# Patient Record
Sex: Female | Born: 1955
Health system: Southern US, Community
[De-identification: ages and names within clinical notes are randomized; demographics above are authoritative.]

## PROBLEM LIST (undated history)

## (undated) DIAGNOSIS — I10 Essential (primary) hypertension: Secondary | ICD-10-CM

## (undated) HISTORY — PX: TONSILLECTOMY: SUR1361

## (undated) HISTORY — PX: ABDOMINAL HYSTERECTOMY: SHX81

## (undated) HISTORY — PX: CARPAL TUNNEL RELEASE: SHX101

## (undated) HISTORY — PX: KNEE ARTHROSCOPY: SUR90

---

## 1998-09-22 ENCOUNTER — Other Ambulatory Visit: Admission: RE | Admit: 1998-09-22 | Discharge: 1998-09-22 | Payer: Self-pay | Admitting: Obstetrics and Gynecology

## 2002-01-20 ENCOUNTER — Emergency Department (HOSPITAL_COMMUNITY): Admission: EM | Admit: 2002-01-20 | Discharge: 2002-01-20 | Payer: Self-pay | Admitting: Emergency Medicine

## 2004-10-11 ENCOUNTER — Encounter: Admission: RE | Admit: 2004-10-11 | Discharge: 2004-10-11 | Payer: Self-pay | Admitting: Obstetrics and Gynecology

## 2004-10-24 ENCOUNTER — Encounter: Admission: RE | Admit: 2004-10-24 | Discharge: 2004-10-24 | Payer: Self-pay | Admitting: Obstetrics and Gynecology

## 2004-11-24 ENCOUNTER — Inpatient Hospital Stay (HOSPITAL_COMMUNITY): Admission: EM | Admit: 2004-11-24 | Discharge: 2004-11-25 | Payer: Self-pay | Admitting: Emergency Medicine

## 2008-10-26 ENCOUNTER — Emergency Department (HOSPITAL_COMMUNITY): Admission: EM | Admit: 2008-10-26 | Discharge: 2008-10-27 | Payer: Self-pay | Admitting: Emergency Medicine

## 2010-02-11 ENCOUNTER — Encounter: Payer: Self-pay | Admitting: Obstetrics and Gynecology

## 2010-04-27 LAB — CBC
HCT: 37.4 % (ref 36.0–46.0)
Hemoglobin: 12.5 g/dL (ref 12.0–15.0)
MCHC: 33.5 g/dL (ref 30.0–36.0)
MCV: 85.3 fL (ref 78.0–100.0)
Platelets: 257 10*3/uL (ref 150–400)
RBC: 4.38 MIL/uL (ref 3.87–5.11)
RDW: 13.8 % (ref 11.5–15.5)
WBC: 7.4 10*3/uL (ref 4.0–10.5)

## 2010-04-27 LAB — POCT CARDIAC MARKERS
CKMB, poc: 1.2 ng/mL (ref 1.0–8.0)
CKMB, poc: 3.4 ng/mL (ref 1.0–8.0)
Myoglobin, poc: 107 ng/mL (ref 12–200)
Myoglobin, poc: 52.2 ng/mL (ref 12–200)
Troponin i, poc: <0.05 ng/mL (ref 0.00–0.09)
Troponin i, poc: <0.05 ng/mL (ref 0.00–0.09)

## 2010-04-27 LAB — DIFFERENTIAL
Basophils Absolute: 0.1 10*3/uL (ref 0.0–0.1)
Basophils Relative: 1 % (ref 0–1)
Eosinophils Absolute: 0.2 10*3/uL (ref 0.0–0.7)
Eosinophils Relative: 3 % (ref 0–5)
Lymphocytes Relative: 43 % (ref 12–46)
Lymphs Abs: 3.2 10*3/uL (ref 0.7–4.0)
Monocytes Absolute: 0.4 10*3/uL (ref 0.1–1.0)
Monocytes Relative: 6 % (ref 3–12)
Neutro Abs: 3.5 10*3/uL (ref 1.7–7.7)
Neutrophils Relative %: 48 % (ref 43–77)

## 2010-04-27 LAB — COMPREHENSIVE METABOLIC PANEL
ALT: 13 U/L (ref 0–35)
AST: 18 U/L (ref 0–37)
Albumin: 4 g/dL (ref 3.5–5.2)
Alkaline Phosphatase: 83 U/L (ref 39–117)
BUN: 12 mg/dL (ref 6–23)
CO2: 27 mEq/L (ref 19–32)
Calcium: 9.4 mg/dL (ref 8.4–10.5)
Chloride: 103 mEq/L (ref 96–112)
Creatinine, Ser: 0.93 mg/dL (ref 0.4–1.2)
GFR calc Af Amer: >60 mL/min (ref >60)
GFR calc non Af Amer: >60 mL/min (ref >60)
Glucose, Bld: 87 mg/dL (ref 70–99)
Potassium: 4.1 mEq/L (ref 3.5–5.1)
Sodium: 137 mEq/L (ref 135–145)
Total Bilirubin: 0.5 mg/dL (ref 0.3–1.2)
Total Protein: 7 g/dL (ref 6.0–8.3)

## 2010-06-09 NOTE — Discharge Summary (Signed)
Samantha Bryant, Samantha Bryant                  ACCOUNT NO.:  [id number]   MEDICAL RECORD NO.:  [id number]          PATIENT TYPE:  INP   LOCATION:  3701                         FACILITY:  [facility]   PHYSICIAN:  Georga Hacking, M.D.DATE OF BIRTH:  November 26, 1955   DATE OF ADMISSION:  11/24/2004  DATE OF DISCHARGE:  11/25/2004                                 DISCHARGE SUMMARY   FINAL DIAGNOSES:  1.  Atypical chest pain of undetermined etiology, myocardial infarction      ruled out.      1.  Negative adenosine Cardiolite scan.      2.  Negative chest CT scan for acute cardiac problems.  2.  Cigarette abuse.  3.  Hyperlipidemia.  4.  Arm numbness of uncertain etiology.      1.  Negative MRI of the brain.  5.  Obesity.  6.  Low density liver lesion of uncertain cause.   PROCEDURE:  1.  CT scan of chest.  2.  Adenosine Cardiolite.  3.  MRI of the brain.   HISTORY:  A 55 year old black female who had some isolated chest discomfort  and then had sharp chest discomfort that was pleuritic the next day.  She  then had arm numbness and was seen at an urgent care and was sent to the  [facility] Emergency Room.  Initial enzymes were negative, and EKG was  normal, and she was given morphine and was admitted to rule out a myocardial  infarction.  Please see the previously dictated history and physical for the  remainder of the details.   HOSPITAL COURSE:  Serial EKGs were negative and serial cardiac enzymes  showed no abnormality.  Laboratory studies showed an abnormal lipid panel  with total cholesterol of over 260 with a LDL cholesterol of 200.  Chemistry  panel was normal.  CBC was normal.  She had an MRI of the brain that showed  no evidence of acute stroke.  She had a CT scan of her chest that showed no  abnormality involving the aorta or the heart and no evidence of pulmonary  emboli.  She had a low density lesion involving her liver that was thought  possibly to be benign but followup will need to be  done by her primary  doctor.  An adenosine Cardiolite scan was done the next day when she had too  much artifact on a baseline EKG with exercise to do treadmill testing.  This  is negative for ischemia with a normal ejection fraction.   At this point, she is discharged home with instructions to discontinue  smoking and to loose weight.   She is discharged on:  1.  Lipitor 40 mg for treatment of hyperlipidemia.  2.  Aspirin 81 mg daily taken with food.  3.  Protonix 40 mg daily.   She is to follow up with myself and Dr. Dagoberto Ligas in one week.      Georga Hacking, M.D.  Electronically Signed     WST/MEDQ  D:  11/25/2004  T:  11/25/2004  Job:  [id number]  cc:   Alfonse Alpers. Dagoberto Ligas, M.D.  Fax: [phone]

## 2010-06-09 NOTE — H&P (Signed)
Samantha Bryant, FRITCHMAN                  ACCOUNT NO.:  [id number]   MEDICAL RECORD NO.:  [id number]          PATIENT TYPE:  EMS   LOCATION:  MAJO                         FACILITY:  [facility]   PHYSICIAN:  Georga Hacking, M.D.DATE OF BIRTH:  31-Jul-1955   DATE OF ADMISSION:  11/24/2004  DATE OF DISCHARGE:                                HISTORY & PHYSICAL   REASON FOR ADMISSION:  Chest pain.   HISTORY:  This 55 year old female is seen in the emergency room  at the  request of Dr. Dagoberto Ligas and the ER physician for evaluation of chest pain.  The patient has a history of obesity and cigarette abuse, but has previously  been in good health.  She, yesterday, developed mid sternal chest discomfort  while standing that was vague and lasted around 5-10 minutes but was not any  different.  She then had complained of having some swelling in her legs  around three weeks ago for which she had seen Dr. Dagoberto Ligas but reported no  injury.  This morning, she was on her way to work and developed sharp chest  pain in her mid sternal chest that was griping and felt as if she had to  bend over.  She then complained of arm pain that radiated down her left arm  and numbness in her left arm, became frightened and went to the Urgent Care  Center.  She was noted to have some pleuritic pain there and then the pain  would wax and wan and she eventually was sent to the [facility] Emergency  Room by ambulance.  Initial point of care enzymes were negative and initial  EKG was normal.  Initial labs were normal.  She has been given morphine and  reports the arm numbness is somewhat better but still present, somewhat, in  her hand.   She has felt well recently except for the fluid retention and denies a  previous history of angina, exercise intolerance, PND, orthopnea, edema,  syncope, palpitations, or claudication.   PAST MEDICAL HISTORY:  Her past history is benign.  She had some sort of a  thyroid nodule or thyroid problem  previously.   PAST SURGICAL HISTORY:  Tonsillectomy and partial hysterectomy.   ALLERGIES:  None.   CURRENT MEDICATIONS:  None.   FAMILY HISTORY:  Her father's health is unknown.  Her mother died of  pneumonia at age 98.  One brother died of a heart attack at age 61.  She has  multiple brothers and sisters, otherwise.   SOCIAL HISTORY:  She currently lives with a brother and sister.  She has one  daughter.  She works as a Production designer, theatre/television/film at a [employer].  She does not  use alcohol to excess.  She smokes a pack of cigarettes per week.   REVIEW OF SYMPTOMS:  She has gained 14 pounds over the past year.  She has  no skin problems.  She has no diplopia, glaucoma, cataracts, or macular  degeneration.  No difficulty hearing, hemoptysis, or difficulty swallowing.  She normally has no dysphagia, dyspepsia, hematochezia, diarrhea, or  melena.  No dysuria, urgency, or frequency.  Occasional mild arthritis involving her  feet, some swelling involving her feet previously.  No history of TIA,  stroke, or headache.  No history of allergies or serious bleeding disorder.   PHYSICAL EXAMINATION:  GENERAL:  She is a pleasant, obese black female who is a vague historian but  appears in no acute distress.  VITAL SIGNS:  Blood pressure is 120/70, pulse 60 and regular.  SKIN:  Warm and dry.  HEENT:  EOMI, PERRLA, CNS clear, fundi not examined, pharynx negative.  NECK:  Supple without masses, JVD, thyromegaly, or bruits.  LUNGS:  Clear to A&P.  CARDIOVASCULAR:  Normal S1 and S2, no S3, S4, or murmur.  ABDOMEN:  Soft, nontender, no masses, hepatosplenomegaly, or aneurysm.  EXTREMITIES:  Femoral and distal pulses are 2+, there is no edema noted.  No  Homan's sign is noted.   LABORATORY DATA:  EKG shows sinus bradycardia.  Initial point of care  enzymes, chemistry panel, and CBC are normal.   IMPRESSION:  1.  Chest pain with atypical features, being somewhat pleuritic, rule out      pulmonary  embolus, noncardiac pain.  2.  Arm numbness with atypical features, rule out stroke, or atypical      presentation of cardiac pain.  3.  Cigarette abuse.  4.  Obesity.   RECOMMENDATIONS:  Begin Lovenox, aspirin, beta blockers.  Check CT scan of  the chest.  MRI of the head.  Check lipid panel.  If rules out, consider  Cardiolite study if enzymes are negative.      Georga Hacking, M.D.  Electronically Signed     WST/MEDQ  D:  11/24/2004  T:  11/24/2004  Job:  [id number]   cc:   Alfonse Alpers. Dagoberto Ligas, M.D.  Fax: [phone]

## 2011-10-03 ENCOUNTER — Encounter (HOSPITAL_COMMUNITY): Payer: Self-pay

## 2011-10-03 ENCOUNTER — Emergency Department (HOSPITAL_COMMUNITY): Payer: Worker's Compensation

## 2011-10-03 ENCOUNTER — Emergency Department (HOSPITAL_COMMUNITY)
Admission: EM | Admit: 2011-10-03 | Discharge: 2011-10-03 | Disposition: A | Discharge status: Home / Self Care | Payer: Worker's Compensation | Attending: Emergency Medicine | Admitting: Emergency Medicine

## 2011-10-03 DIAGNOSIS — Y92009 Unspecified place in unspecified non-institutional (private) residence as the place of occurrence of the external cause: Secondary | ICD-10-CM | POA: Insufficient documentation

## 2011-10-03 DIAGNOSIS — M542 Cervicalgia: Secondary | ICD-10-CM | POA: Insufficient documentation

## 2011-10-03 DIAGNOSIS — S0990XA Unspecified injury of head, initial encounter: Secondary | ICD-10-CM

## 2011-10-03 DIAGNOSIS — S161XXA Strain of muscle, fascia and tendon at neck level, initial encounter: Secondary | ICD-10-CM

## 2011-10-03 DIAGNOSIS — S139XXA Sprain of joints and ligaments of unspecified parts of neck, initial encounter: Secondary | ICD-10-CM | POA: Insufficient documentation

## 2011-10-03 DIAGNOSIS — W11XXXA Fall on and from ladder, initial encounter: Secondary | ICD-10-CM | POA: Insufficient documentation

## 2011-10-03 DIAGNOSIS — W19XXXA Unspecified fall, initial encounter: Secondary | ICD-10-CM

## 2011-10-03 MED ORDER — HYDROCODONE-ACETAMINOPHEN 5-500 MG PO TABS: 1.0000 | ORAL_TABLET | Freq: Four times a day (QID) | ORAL | Status: AC | PRN

## 2011-10-03 MED ORDER — OXYCODONE-ACETAMINOPHEN 5-325 MG PO TABS: 1.0000 | ORAL_TABLET | Freq: Four times a day (QID) | ORAL | Status: AC | PRN

## 2011-10-03 NOTE — ED Notes (Signed)
Pt was on 66ft ladder and fell onto back onto floor of [employer]. Pt c/o left hand, right forearm and back of head.  No LOC. Pt received in c-collar

## 2011-10-03 NOTE — ED Provider Notes (Signed)
History     CSN: [id number]  Arrival date & time 10/03/11  1622   First MD Initiated Contact with Patient 10/03/11 1755      Chief Complaint  Patient presents with  . Fall    (Consider location/radiation/quality/duration/timing/severity/associated sxs/prior treatment) Patient is a 56 y.o. female presenting with fall. The history is provided by the patient.  Fall The accident occurred 1 to 2 hours ago. 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 She fell from a height of 1 to 2 ft. She landed on a hard floor. There was no blood loss. The point of impact was the head and neck (left hand). The pain is present in the head and neck (left hand). The pain is moderate. She was not ambulatory at the scene. Pertinent negatives include no visual change and no abdominal pain. The symptoms are aggravated by activity (movement). She has tried nothing for the symptoms.    No past medical history on file.  Past Surgical History  Procedure Date  . Abdominal hysterectomy   . Tonsillectomy   . Knee arthroscopy   . Carpal tunnel release     No family history on file.  History  Substance Use Topics  . Smoking status: Never Smoker   . Smokeless tobacco: Not on file  . Alcohol Use: No    OB History    Grav Para Term Preterm Abortions TAB SAB Ect Mult Living                  Review of Systems  Gastrointestinal: Negative for abdominal pain.  All other systems reviewed and are negative.    Allergies  Review of patient's allergies indicates no known allergies.  Home Medications   Current Outpatient Rx  Name Route Sig Dispense Refill  . NAPROXEN SODIUM 220 MG PO TABS Oral Take 220 mg by mouth 2 (two) times daily as needed. For pain      BP 117/68  Pulse 53  Temp 97.9 F (36.6 C) (Oral)  Resp 16  SpO2 100%  Physical Exam  Nursing note and vitals reviewed. Constitutional: She is oriented to person, place, and time. She appears well-developed and well-nourished. No distress.    HENT:  Head: Normocephalic and atraumatic.       TM's are clear bilaterally.  Eyes: EOM are normal. Pupils are equal, round, and reactive to light.  Neck:       There is ttp in the soft tissues of the mid neck.  There is no bony ttp and no stepoffs.    Cardiovascular: Normal rate and regular rhythm.  Exam reveals no gallop and no friction rub.   No murmur heard. Pulmonary/Chest: Effort normal and breath sounds normal. No respiratory distress. She has no wheezes.  Abdominal: Soft. Bowel sounds are normal. She exhibits no distension. There is no tenderness.  Musculoskeletal: Normal range of motion.       The left hand is ttp but there is no obvious swelling or deformity.    Neurological: She is alert and oriented to person, place, and time. No cranial nerve deficit. Coordination normal.  Skin: Skin is warm and dry. She is not diaphoretic.    ED Course  Procedures (including critical care time)  Labs Reviewed - No data to display No results found.   No diagnosis found.    MDM  The ct is negative for fracture or intracranial abnormality.  The xray of the hand shows what appears to be a fracture of the distal  thumb, however she has no pain or swelling in that area and range of motion is full without limitation.  I suspect this is old.  She will be discharged with rest, time, and medications for pain.  To return prn.        Geoffery Lyons, MD 10/04/11 [id number]

## 2011-10-03 NOTE — ED Notes (Signed)
Patient transported to X-ray 

## 2011-10-03 NOTE — ED Notes (Signed)
Pt resting quietly, watching TV, family member at bedside

## 2011-10-03 NOTE — ED Notes (Signed)
Pt remains in xray.

## 2017-02-19 ENCOUNTER — Ambulatory Visit (INDEPENDENT_AMBULATORY_CARE_PROVIDER_SITE_OTHER): Payer: 59

## 2017-02-19 ENCOUNTER — Ambulatory Visit (INDEPENDENT_AMBULATORY_CARE_PROVIDER_SITE_OTHER): Payer: 59 | Admitting: Orthopaedic Surgery

## 2017-02-19 ENCOUNTER — Encounter (INDEPENDENT_AMBULATORY_CARE_PROVIDER_SITE_OTHER): Payer: Self-pay | Admitting: Orthopaedic Surgery

## 2017-02-19 VITALS — BP 139/78 | HR 54 | Ht 65.0 in | Wt 258.0 lb

## 2017-02-19 DIAGNOSIS — M25562 Pain in left knee: Secondary | ICD-10-CM | POA: Diagnosis not present

## 2017-02-19 DIAGNOSIS — M170 Bilateral primary osteoarthritis of knee: Secondary | ICD-10-CM

## 2017-02-19 NOTE — Progress Notes (Signed)
Office Visit Note   Patient: Samantha Bryant           Date of Birth: 1955-05-24           MRN: [id number] Visit Date: 02/19/2017              Requested by: No referring provider defined for this encounter. PCP: Default, Provider, MD   Assessment & Plan: Visit Diagnoses:  1. Acute pain of left knee   2. Bilateral primary osteoarthritis of knee     Plan: Patient can work on weight loss.  Develops increased symptoms mechanical locking she will return.  We reviewed the x-rays and discussed that she may require total knee arthroplasty at some point and she needs to work on weight loss to get her BMI below 40.  We discussed using an exercise bike , decreasedcaloric intake diet options,   talking with the dietitian like referral.  Plan to recheck her in 6 months.  If she develops increased symptoms ankle locking or catching she will return earlier.  Follow-Up Instructions: Return in about 6 months (around 08/19/2017).   Orders:  Orders Placed This Encounter  Procedures  . XR KNEE 3 VIEW LEFT   No orders of the defined types were placed in this encounter.     Procedures: No procedures performed   Clinical Data: No additional findings.   Subjective: Chief Complaint  Patient presents with  . Left Knee - Pain    HPI 62 year old female seen with progressive left knee pain that has been worse in the last several weeks.  States her knee popped 1 day and then its significantly sore for the following 2 weeks.  She is used Advil and Aleve in both make her sleepy.  She had previous left knee arthroscopy for partial meniscectomy 2012.  Not been seen since 09/14/2014.  Review of Systems arthroscopy 2012 left knee.  Positive for obesity the otherwise without significant medical problems.  His hysterectomy tonsillectomy carpal tunnel release..  Patient has never smoked does not drink.   Objective: Vital Signs: BP 139/78   Pulse (!) 54   Ht 5\' 5"  (1.651 m)   Wt 258 lb (117 kg)   BMI  42.93 kg/m   Physical Exam  Constitutional: She is oriented to person, place, and time. She appears well-developed.  HENT:  Head: Normocephalic.  Right Ear: External ear normal.  Left Ear: External ear normal.  Eyes: Pupils are equal, round, and reactive to light.  Neck: No tracheal deviation present. No thyromegaly present.  Cardiovascular: Normal rate.  Pulmonary/Chest: Effort normal.  Abdominal: Soft.  Neurological: She is alert and oriented to person, place, and time.  Skin: Skin is warm and dry.  Psychiatric: She has a normal mood and affect. Her behavior is normal.    Ortho Exam hip range of motion.  The left knee shows 2+ knee effusion.  Crepitus with range of motion left knee.  Collateral ligaments are stable ACL exam is normal.  Well-healed arthroscopic portals from her P previous medial meniscectomy and medial femoral condyle microfracture.  Negative pivot shift.  Pulses are palpable no sciatic notch tenderness stasis.  Negative Homan.  Distal pulses are palpable.  Specialty Comments:  No specialty comments available.  Imaging: Xr Knee 3 View Left  Result Date: 02/20/2017 Standing AP both knees lateral left knee and patellar view demonstrates bilateral medial compartment narrowing loss of mottling post microfracture..  Negative for acute injury.  Patellofemoral spurring. Impression: Bilateral knee osteoarthritis involvement  medial compartment    PMFS History: There are no active problems to display for this patient.  History reviewed. No pertinent past medical history.  No family history on file.  Past Surgical History:  Procedure Laterality Date  . ABDOMINAL HYSTERECTOMY    . CARPAL TUNNEL RELEASE    . KNEE ARTHROSCOPY    . TONSILLECTOMY     Social History   Occupational History  . Not on file  Tobacco Use  . Smoking status: Former Smoker    Years: 3.00  . Smokeless tobacco: Never Used  Substance and Sexual Activity  . Alcohol use: No  . Drug use: No    . Sexual activity: Not on file

## 2017-02-20 ENCOUNTER — Encounter (INDEPENDENT_AMBULATORY_CARE_PROVIDER_SITE_OTHER): Payer: Self-pay | Admitting: Orthopaedic Surgery

## 2017-08-20 ENCOUNTER — Ambulatory Visit (INDEPENDENT_AMBULATORY_CARE_PROVIDER_SITE_OTHER): Payer: 59 | Admitting: Orthopaedic Surgery

## 2017-11-01 ENCOUNTER — Telehealth (HOSPITAL_COMMUNITY): Payer: Self-pay

## 2017-11-01 NOTE — Telephone Encounter (Signed)
Left a voicemail to call [facility]

## 2017-11-12 NOTE — Progress Notes (Addendum)
[facility] Note  11/13/2017     CHIEF COMPLAINT Patient presents for Retina Evaluation   HISTORY OF PRESENT ILLNESS: Samantha Bryant is a 62 y.o. female who presents to the clinic today for:   HPI    Retina Evaluation    In left eye.  Associated Symptoms Negative for Flashes, Blind Spot, Photophobia, Scalp Tenderness, Fever, Floaters, Pain, Glare, Jaw Claudication, Weight Loss, Distortion, Redness, Trauma, Shoulder/Hip pain and Fatigue.  Context:  distance vision and watching TV.  Treatments tried include no treatments.  I, the attending physician,  performed the HPI with the patient and updated documentation appropriately.          Comments    Patient presents for retinal eval for possible pre-retinal fibrosis OS. Patient states vision blurred on occasion. Blinking seems to clear vision. Wears glasses on occasion for TV. Patient denies visual distortion. Patient denies floaters and flashes.        Last edited by Bernarda Caffey, MD on 11/13/2017  9:24 AM. (History)    pt states she has been seeing Dr. Wynetta Emery for a year, before that she saw someone for routine vision only, she states the scar in her eye was not there last year, pt states she had the flu in May or June, but was not hospitalized, pt states the only change in vision she noticed was her eyes get blurry when she watches TV, she denies floaters and blind spots, she denies premature birth and states there is no family hx of any eye problems, she denies hx of fungal infections, she denies having fever or chills recently, pt states she had an infection in her toe and had to have sx, pt states she was on lamisil pills before sx, pt states she had the sx on 10.25.18, afterwards she was only on pain medication, pt denies any trauma to the eye, and denies that she has gotten anything into her eye  Referring physician: Gwendalyn Ege, MD [address] [place], [place] [address]  HISTORICAL  INFORMATION:   Selected notes from the MEDICAL RECORD NUMBER Referred by Dr. Len Blalock for concern of retinal vasculitis LEE: 10.01.19 Mellody Memos) [BCVA: OD: 20/20- OS: 20/20-] Ocular Hx- PMH-    CURRENT MEDICATIONS: No current outpatient medications on file. (Ophthalmic Drugs)   No current facility-administered medications for this visit.  (Ophthalmic Drugs)   Current Outpatient Medications (Other)  Medication Sig  . atorvastatin (LIPITOR) 20 MG tablet Take 20 mg by mouth every other day.  . naproxen sodium (ANAPROX) 220 MG tablet Take 220 mg by mouth 2 (two) times daily as needed. For pain   No current facility-administered medications for this visit.  (Other)      REVIEW OF SYSTEMS: ROS    Positive for: Eyes   Negative for: Constitutional, Gastrointestinal, Neurological, Skin, Genitourinary, Musculoskeletal, HENT, Endocrine, Cardiovascular, Respiratory, Psychiatric   Last edited by Roselee Nova D on 11/13/2017  8:03 AM. (History)       ALLERGIES Allergies  Allergen Reactions  . Hydrocodone     PAST MEDICAL HISTORY History reviewed. No pertinent past medical history. Past Surgical History:  Procedure Laterality Date  . ABDOMINAL HYSTERECTOMY    . CARPAL TUNNEL RELEASE    . KNEE ARTHROSCOPY    . TONSILLECTOMY      FAMILY HISTORY Family History  Problem Relation Age of Onset  . Diabetes Mother   . Glaucoma Mother     SOCIAL HISTORY Social History  Tobacco Use  . Smoking status: Former Smoker    Years: 3.00  . Smokeless tobacco: Never Used  Substance Use Topics  . Alcohol use: No  . Drug use: No         OPHTHALMIC EXAM:  Base Eye Exam    Visual Acuity (Snellen - Linear)      Right Left   Dist cc 20/25 20/20 -2   Dist ph cc 20/20 -1 20/20 -2   Correction:  Glasses       Tonometry (Tonopen, 8:14 AM)      Right Left   Pressure 20 18       Pupils      Dark Light Shape React APD   Right 4 3 Round Slow None   Left 4 3 Round Slow None        Visual Fields (Counting fingers)      Left Right    Full Full       Extraocular Movement      Right Left    Full, Ortho Full, Ortho       Neuro/Psych    Oriented x3:  Yes   Mood/Affect:  Normal       Dilation    Both eyes:  1.0% Mydriacyl, 2.5% Phenylephrine @ 8:14 AM        Slit Lamp and Fundus Exam    Slit Lamp Exam      Right Left   Lids/Lashes Dermatochalasis - upper lid Dermatochalasis - upper lid   Conjunctiva/Sclera mild Melanosis mild Melanosis   Cornea 1-2+ Punctate epithelial erosions, mild Arcus 1-2+ Punctate epithelial erosions, mild Arcus   Anterior Chamber Deep and quiet Deep and quiet   Iris Round and dilated, PPM nasally Round and dilated, PPM nasally   Lens 2+ Cortical cataract, 2-3+ Nuclear sclerosis 3+ Cortical cataract, 2-3+ Nuclear sclerosis   Vitreous Vitreous syneresis Vitreous syneresis, trace cell in anterior vit, white fibrotic vitreous band exterting traction along distal IT arcade, otherwise clear       Fundus Exam      Right Left   Disc Pink and Sharp Pink and Sharp, strain of fibrosis eminating from 0800 rim   C/D Ratio 0.5 0.4   Macula Flat, Good foveal reflex, mild Retinal pigment epithelial mottling, No heme or edema Flat, Good foveal reflex, mild Retinal pigment epithelial mottling, No heme or edema   Vessels mild Vascular attenuation mild Vascular attenuation, white fibrotic vitreous band exterting traction along distal IT arcade   Periphery Attached Attached        Refraction    Wearing Rx      Sphere Cylinder Axis Add   Right -2.00 +0.50 173 +2.00   Left -1.50 +0.75 162 +2.00   Age:  1 yr   Type:  PAL       Manifest Refraction (Over)      Sphere Cylinder Axis Dist VA   Right -1.75 +0.75 175 20/20   Left -1.25 +0.75 160 20/20-1          IMAGING AND PROCEDURES  Imaging and Procedures for '@TODAY'$ @  DG Chest 2 View       CLINICAL DATA:  Retinal vasculitis, evaluate for tuberculosis or sarcoid.  EXAM: CHEST  - 2 VIEW  COMPARISON:  10/26/2008.  FINDINGS: The heart size and mediastinal contours are within normal limits. Both lungs are clear. The visualized skeletal structures are unremarkable.  IMPRESSION: No active cardiopulmonary disease.  Stable chest.   Electronically Signed   By:  Staci Righter M.D.   On: 11/13/2017 15:18        Angiotensin converting enzyme     Component Value Flag Ref Range Units Status   Angiotensin-Converting Enzyme 28    14 - 82 U/L Final   Comment:   (NOTE) Performed At: [facility] [place], [place] [id number] Rush Farmer MD [id number]         ANA, IFA (with reflex)     Component   ANA Ab, IFA   Negative    Comment:   (NOTE)                                     Negative   <1:80                                     Borderline  1:80                                     Positive   >1:80 Performed At: [facility] [place], [place] [id number] Rush Farmer MD [id number]         Pan-ANCA     Component Value Flag Ref Range Units Status   Myeloperoxidase Abs <9.0    0.0 - 9.0 U/mL Final   ANCA Proteinase 3 <3.5    0.0 - 3.5 U/mL Final   C-ANCA <1:20    Neg:<1:20 titer Final   P-ANCA <1:20    Neg:<1:20 titer Final   Comment:   (NOTE) The presence of positive fluorescence exhibiting P-ANCA or C-ANCA patterns alone is not specific for the diagnosis of Wegener's Granulomatosis (WG) or microscopic polyangiitis. Decisions about treatment should not be based solely on ANCA IFA results.  The International ANCA Group Consensus recommends follow up testing of positive sera with both PR-3 and MPO-ANCA enzyme immunoassays. As many as 5% serum samples are positive only by EIA. Ref. AM J Clin Pathol 1999;111:507-513.    Atypical P-ANCA titer <1:20    Neg:<1:20 titer Final   Comment:   (NOTE) The atypical pANCA pattern has been observed in a significant percentage of patients with ulcerative  colitis, primary sclerosing cholangitis and autoimmune hepatitis. Performed At: [facility] [place], [place] [id number] Rush Farmer MD [id number]         CBC     Component Value Flag Ref Range Units Status   WBC 5.1    4.0 - 10.5 K/uL Final   RBC 4.12    3.87 - 5.11 MIL/uL Final   Hemoglobin 11.4    12.0 - 15.0 g/dL Final   HCT 37.4    36.0 - 46.0 % Final   MCV 90.8    80.0 - 100.0 fL Final   MCH 27.7    26.0 - 34.0 pg Final   MCHC 30.5    30.0 - 36.0 g/dL Final   RDW 13.0    11.5 - 15.5 % Final   Platelets 240    150 - 400 K/uL Final   nRBC 0.0    0.0 - 0.2 % Final   Comment:   Performed at [facility], [place]. [address], [place], [place] [address]        Comprehensive metabolic  panel     Component Value Flag Ref Range Units Status   Sodium 140    135 - 145 mmol/L Final   Potassium 4.5    3.5 - 5.1 mmol/L Final   Chloride 106    98 - 111 mmol/L Final   CO2 26    22 - 32 mmol/L Final   Glucose, Bld 103    70 - 99 mg/dL Final   BUN 15    8 - 23 mg/dL Final   Creatinine, Ser 1.00    0.44 - 1.00 mg/dL Final   Calcium 9.6    8.9 - 10.3 mg/dL Final   Total Protein 6.7    6.5 - 8.1 g/dL Final   Albumin 3.8    3.5 - 5.0 g/dL Final   AST 15    15 - 41 U/L Final   ALT 10    0 - 44 U/L Final   Alkaline Phosphatase 88    38 - 126 U/L Final   Total Bilirubin 0.4    0.3 - 1.2 mg/dL Final   GFR calc non Af Amer 59    >60 mL/min Final   GFR calc Af Amer >60    >60 mL/min Final   Comment:   (NOTE) The eGFR has been calculated using the CKD EPI equation. This calculation has not been validated in all clinical situations. eGFR's persistently <60 mL/min signify possible Chronic Kidney Disease.    Anion gap 8    5 - 15  Final   Comment:   Performed at [facility] [address], [place], [place] [address]        C-reactive protein     Component Value Flag Ref Range Units Status   CRP 0.9    <1.0 mg/dL Final   Comment:   Performed at  [facility], [place] [address] [place], [place] [address]        Sedimentation rate     Component Value Flag Ref Range Units Status   Sed Rate 22    0 - 22 mm/hr Final   Comment:   Performed at [facility], [place] [address], [place], [place] [address]        QuantiFERON-TB Gold Plus     Component Value Flag Ref Range Units Status   QuantiFERON Incubation      Final   Incubation performed.    QuantiFERON-TB Gold Plus Negative    Negative  Final   Comment:   (NOTE) Performed At: [facility] [place], [place] [id number] Rush Farmer MD [id number]         Rheumatoid factor     Component Value Flag Ref Range Units Status   Rhuematoid fact SerPl-aCnc 10.3    0.0 - 13.9 IU/mL Final   Comment:   (NOTE) Performed At: [facility] [place], [place] [id number] Rush Farmer MD [id number]         RPR     Component Value Flag Ref Range Units Status   RPR Ser Ql Non Reactive    Non Reactive  Final   Comment:   (NOTE) Performed At: [facility] [place], [place] [id number] Rush Farmer MD [id number]         Toxoplasma antibodies- IgG and  IgM     Component Value Flag Ref Range Units Status   Toxoplasma IgG Ratio <3.0    0.0 - 7.1 IU/mL Final  Comment:   (NOTE)                                 Negative        <7.2                                 Equivocal  7.2 - 8.7                                 Positive        >8.7    Toxoplasma Antibody- IgM <3.0    0.0 - 7.9 AU/mL Final   Comment:   (NOTE)                             Negative            <8.0                             Equivocal      8.0 - 9.9                             Positive            >9.9    Comment Comment      Final   Comment:   (NOTE) No serological evidence of infection with Toxoplasma. If symptoms persist, submit a new specimen after three weeks. Performed At: [facility] [place], [place] [id number] Rush Farmer MD [id number]         QuantiFERON-TB Gold Plus     Component Value Flag Ref Range Units Status   QuantiFERON Criteria Comment      Final   Comment:   (NOTE) The QuantiFERON-TB Gold Plus result is determined by subtracting the Nil value from either TB antigen (Ag) tube. The mitogen tube serves as a control for the test.    QuantiFERON TB1 Ag Value 0.02     IU/mL Final   QuantiFERON TB2 Ag Value 0.03     IU/mL Final   QuantiFERON Nil Value 0.03     IU/mL Final   QuantiFERON Mitogen Value >10.00     IU/mL Final   Comment:   (NOTE) Performed At: [facility] [place], [place] [id number] Rush Farmer MD [id number]         OCT, Retina - OU - Both Eyes       Right Eye Quality was good. Central Foveal Thickness: 254. Progression has no prior data. Findings include normal foveal contour, no IRF, no SRF, vitreomacular adhesion .   Left Eye Quality was good. Central Foveal Thickness: 267. Progression has no prior data. Findings include preretinal fibrosis, vitreous traction, normal foveal contour, no IRF, no SRF (preretinal fibrosis w/ traction and IRF at distal IT arcade).   Notes *Images captured and stored on drive  Diagnosis / Impression:  NFP; no IRF/SRF OU centrally OS: preretinal fibrosis w/ traction and IRF at distal IT arcade  Clinical management:  See below  Abbreviations: NFP - Normal foveal profile. CME - cystoid macular edema. PED - pigment epithelial detachment. IRF - intraretinal fluid. SRF - subretinal fluid. EZ -  ellipsoid zone. ERM - epiretinal membrane. ORA - outer retinal atrophy. ORT - outer retinal tubulation. SRHM - subretinal hyper-reflective material        Fluorescein Angiography Optos (Transit OS)       Right Eye   Progression has no prior data. Early phase findings include normal observations. Mid/Late phase findings include normal observations.   Left  Eye   Progression has no prior data. Early phase findings include blockage, normal observations. Mid/Late phase findings include blockage, normal observations (Blockage without leakage or staining at distal IT arcade with fibrosis).   Notes Images stored on drive;   Impression: OD: Normal study OS: hypofluorescent blockage corresponding to area of vitreous fibrosis; no leakage or staining at site of vitreous traction--no obvious vascular component                  ASSESSMENT/PLAN:    ICD-10-CM   1. 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 Retinal edema H35.81 OCT, Retina - OU - Both Eyes  3. Left posterior uveitis H30.92   4. 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 Hypertensive retinopathy of both eyes H35.033 Fluorescein Angiography Optos (Transit OS)    1. Focal vitreous condensation/fibrosis and traction along distal inf temp arcade, OS - pt asymptomatic w/ VA remaining 20/20 OS - unclear etiology - differential dx includes inflammatory, infectious, idiopathic or other - will initiate work up - CBC, CMP - ANA, ACE, ANCA, CRP, ESR, HLA profile, Quant-Gold, RF, RPR, Toxoplasma Abs - bacterial and fungal blood cultures - CXR - f/u in 2-3 wks, sooner prn  2. +Retinal edema at tractional site along distal IT arcade  3,4. No posterior uveitis or retinal vasculitis on FA - lab work up as above to r/o inflammatory, infectious etiologies  5. No HTN retinopathy  Ophthalmic Meds Ordered this visit:  No orders of the defined types were  placed in this encounter.      Return for F/U 3-4 weeks vasculitis, DFE, OCT.  There are no Patient Instructions on file for this visit.   Explained the diagnoses, plan, and follow up with the patient and they expressed understanding.  Patient expressed understanding of the importance of proper follow up care.   This document serves as a record of services personally performed by Gardiner Sleeper, MD, PhD. It was created on their behalf by Ernest Mallick, OA, an ophthalmic assistant. The creation of this record is the provider's dictation and/or activities during the visit.    Electronically signed by: Ernest Mallick, OA  10.22.19 3:13 PM    Gardiner Sleeper, M.D., Ph.D. Diseases & Surgery of the Retina and Vitreous [facility]  I have reviewed the above documentation for accuracy and completeness, and I agree with the above. Gardiner Sleeper, M.D., Ph.D. 11/17/17 3:22 PM      Abbreviations: M myopia (nearsighted); A astigmatism; H hyperopia (farsighted); P  presbyopia; Mrx spectacle prescription;  CTL contact lenses; OD right eye; OS left eye; OU both eyes  XT exotropia; ET esotropia; PEK punctate epithelial keratitis; PEE punctate epithelial erosions; DES dry eye syndrome; MGD meibomian gland dysfunction; ATs artificial tears; PFAT's preservative free artificial tears; [place] nuclear sclerotic cataract; PSC posterior subcapsular cataract; ERM epi-retinal membrane; PVD posterior vitreous detachment; RD retinal detachment; DM diabetes mellitus; DR diabetic retinopathy; NPDR non-proliferative diabetic retinopathy; PDR proliferative diabetic retinopathy; CSME clinically significant macular edema; DME diabetic macular edema; dbh dot blot hemorrhages; CWS cotton wool spot; POAG primary open angle glaucoma; C/D cup-to-disc ratio; HVF humphrey visual field; GVF goldmann visual field; OCT optical coherence tomography; IOP intraocular pressure; BRVO Branch retinal vein occlusion; CRVO  central retinal vein occlusion; CRAO central retinal artery occlusion; BRAO branch retinal artery occlusion; RT retinal tear; SB scleral buckle; PPV pars plana vitrectomy; VH Vitreous hemorrhage; PRP panretinal laser photocoagulation; IVK intravitreal kenalog; VMT vitreomacular traction; MH Macular hole;  NVD neovascularization of the disc; NVE neovascularization elsewhere; AREDS age related eye disease study; ARMD age related macular degeneration; POAG primary open angle glaucoma; EBMD epithelial/anterior basement membrane dystrophy; ACIOL anterior chamber intraocular lens; IOL intraocular lens; PCIOL posterior chamber intraocular lens; Phaco/IOL phacoemulsification with intraocular lens placement; [place] photorefractive keratectomy; LASIK laser assisted in situ keratomileusis; HTN hypertension; DM diabetes mellitus; COPD chronic obstructive pulmonary disease

## 2017-11-13 ENCOUNTER — Ambulatory Visit (INDEPENDENT_AMBULATORY_CARE_PROVIDER_SITE_OTHER): Payer: Self-pay | Admitting: Ophthalmology

## 2017-11-13 ENCOUNTER — Encounter (INDEPENDENT_AMBULATORY_CARE_PROVIDER_SITE_OTHER): Payer: Self-pay | Admitting: Ophthalmology

## 2017-11-13 ENCOUNTER — Ambulatory Visit (HOSPITAL_COMMUNITY)
Admission: RE | Admit: 2017-11-13 | Discharge: 2017-11-13 | Disposition: A | Discharge status: Home / Self Care | Payer: Self-pay | Source: Ambulatory Visit | Attending: Ophthalmology | Admitting: Ophthalmology

## 2017-11-13 DIAGNOSIS — H35033 Hypertensive retinopathy, bilateral: Secondary | ICD-10-CM

## 2017-11-13 DIAGNOSIS — H35062 Retinal vasculitis, left eye: Secondary | ICD-10-CM

## 2017-11-13 DIAGNOSIS — H3092 Unspecified chorioretinal inflammation, left eye: Secondary | ICD-10-CM

## 2017-11-13 DIAGNOSIS — H3581 Retinal edema: Secondary | ICD-10-CM

## 2017-11-13 DIAGNOSIS — H43312 Vitreous membranes and strands, left eye: Secondary | ICD-10-CM

## 2017-11-13 LAB — CBC
HEMATOCRIT: 37.4 % (ref 36.0–46.0)
Hemoglobin: 11.4 g/dL — ABNORMAL LOW (ref 12.0–15.0)
MCH: 27.7 pg (ref 26.0–34.0)
MCHC: 30.5 g/dL (ref 30.0–36.0)
MCV: 90.8 fL (ref 80.0–100.0)
Platelets: 240 10*3/uL (ref 150–400)
RBC: 4.12 MIL/uL (ref 3.87–5.11)
RDW: 13 % (ref 11.5–15.5)
WBC: 5.1 10*3/uL (ref 4.0–10.5)
nRBC: 0 % (ref 0.0–0.2)

## 2017-11-13 LAB — COMPREHENSIVE METABOLIC PANEL
ALT: 10 U/L (ref 0–44)
ANION GAP: 8 (ref 5–15)
AST: 15 U/L (ref 15–41)
Albumin: 3.8 g/dL (ref 3.5–5.0)
Alkaline Phosphatase: 88 U/L (ref 38–126)
BILIRUBIN TOTAL: 0.4 mg/dL (ref 0.3–1.2)
BUN: 15 mg/dL (ref 8–23)
CO2: 26 mmol/L (ref 22–32)
Calcium: 9.6 mg/dL (ref 8.9–10.3)
Chloride: 106 mmol/L (ref 98–111)
Creatinine, Ser: 1 mg/dL (ref 0.44–1.00)
GFR calc Af Amer: >60 mL/min (ref >60)
GFR calc non Af Amer: 59 mL/min — ABNORMAL LOW (ref >60)
Glucose, Bld: 103 mg/dL — ABNORMAL HIGH (ref 70–99)
POTASSIUM: 4.5 mmol/L (ref 3.5–5.1)
Sodium: 140 mmol/L (ref 135–145)
TOTAL PROTEIN: 6.7 g/dL (ref 6.5–8.1)

## 2017-11-13 LAB — SEDIMENTATION RATE: Sed Rate: 22 mm/hr (ref 0–22)

## 2017-11-13 LAB — C-REACTIVE PROTEIN: CRP: 0.9 mg/dL (ref <1.0)

## 2017-11-14 LAB — TOXOPLASMA ANTIBODIES- IGG AND  IGM
Toxoplasma Antibody- IgM: <3 AU/mL (ref 0.0–7.9)
Toxoplasma IgG Ratio: <3 IU/mL (ref 0.0–7.1)

## 2017-11-14 LAB — PAN-ANCA
ANCA Proteinase 3: <3.5 U/mL (ref 0.0–3.5)
C-ANCA: <1:20 {titer}

## 2017-11-14 LAB — RPR: RPR Ser Ql: NONREACTIVE

## 2017-11-14 LAB — ANGIOTENSIN CONVERTING ENZYME: Angiotensin-Converting Enzyme: 28 U/L (ref 14–82)

## 2017-11-14 LAB — RHEUMATOID FACTOR: RHEUMATOID FACTOR: 10.3 [IU]/mL (ref 0.0–13.9)

## 2017-11-14 LAB — ANTINUCLEAR ANTIBODIES, IFA: ANA Ab, IFA: NEGATIVE

## 2017-11-16 LAB — QUANTIFERON-TB GOLD PLUS (RQFGPL)
QUANTIFERON NIL VALUE: 0.03 [IU]/mL
QUANTIFERON TB2 AG VALUE: 0.03 [IU]/mL
QuantiFERON Mitogen Value: >10 IU/mL
QuantiFERON TB1 Ag Value: 0.02 IU/mL

## 2017-11-16 LAB — QUANTIFERON-TB GOLD PLUS: QUANTIFERON-TB GOLD PLUS: NEGATIVE

## 2017-11-17 ENCOUNTER — Encounter (INDEPENDENT_AMBULATORY_CARE_PROVIDER_SITE_OTHER): Payer: Self-pay | Admitting: Ophthalmology

## 2017-11-18 ENCOUNTER — Other Ambulatory Visit (HOSPITAL_COMMUNITY): Payer: Self-pay | Admitting: *Deleted

## 2017-11-18 DIAGNOSIS — Z1231 Encounter for screening mammogram for malignant neoplasm of breast: Secondary | ICD-10-CM

## 2017-11-19 LAB — MISC LABCORP TEST (SEND OUT): Labcorp test code: 167116

## 2017-12-10 NOTE — Progress Notes (Signed)
[facility] Note  12/11/2017     CHIEF COMPLAINT Patient presents for Retina Follow Up   HISTORY OF PRESENT ILLNESS: Samantha Bryant is a 62 y.o. female who presents to the clinic today for:   HPI    Retina Follow Up    Patient presents with  Other.  In left eye.  This started weeks ago.  Severity is mild.  Duration of 4 weeks.  Since onset it is stable.  I, the attending physician,  performed the HPI with the patient and updated documentation appropriately.          Comments    62 y/o female pt here for 4 wk f/u for vasculitis OS.  No change in [place] OU.  Denies pain, flashes, floaters.  No gtts.       Last edited by Bernarda Caffey, MD on 12/11/2017  8:12 AM. (History)      Referring physician: No referring provider defined for this encounter.  HISTORICAL INFORMATION:   Selected notes from the MEDICAL RECORD NUMBER Referred by Dr. Len Blalock for concern of retinal vasculitis LEE: 10.01.19 Mellody Memos) [BCVA: OD: 20/20- OS: 20/20-] Ocular Hx- PMH-    CURRENT MEDICATIONS: No current outpatient medications on file. (Ophthalmic Drugs)   No current facility-administered medications for this visit.  (Ophthalmic Drugs)   Current Outpatient Medications (Other)  Medication Sig  . atorvastatin (LIPITOR) 20 MG tablet Take 20 mg by mouth every other day.  . naproxen sodium (ANAPROX) 220 MG tablet Take 220 mg by mouth 2 (two) times daily as needed. For pain   No current facility-administered medications for this visit.  (Other)      REVIEW OF SYSTEMS: ROS    Positive for: Eyes   Negative for: Constitutional, Gastrointestinal, Neurological, Skin, Genitourinary, Musculoskeletal, HENT, Endocrine, Cardiovascular, Respiratory, Psychiatric, Allergic/Imm, Heme/Lymph   Last edited by Matthew Folks, COA on 12/11/2017  8:04 AM. (History)       ALLERGIES Allergies  Allergen Reactions  . Hydrocodone     PAST MEDICAL HISTORY History reviewed.  No pertinent past medical history. Past Surgical History:  Procedure Laterality Date  . ABDOMINAL HYSTERECTOMY    . CARPAL TUNNEL RELEASE    . KNEE ARTHROSCOPY    . TONSILLECTOMY      FAMILY HISTORY Family History  Problem Relation Age of Onset  . Diabetes Mother   . Glaucoma Mother     SOCIAL HISTORY Social History   Tobacco Use  . Smoking status: Former Smoker    Years: 3.00  . Smokeless tobacco: Never Used  Substance Use Topics  . Alcohol use: No  . Drug use: No         OPHTHALMIC EXAM:  Base Eye Exam    Visual Acuity (Snellen - Linear)      Right Left   Dist cc 20/25 -2 20/25   Dist ph cc 20/20 - 20/20 -2   Correction:  Glasses       Tonometry (Tonopen, 8:08 AM)      Right Left   Pressure 15 15       Pupils      Dark Light Shape React APD   Right 4 3 Round Slow None   Left 4 3 Round Slow None       Visual Fields (Counting fingers)      Left Right    Full Full       Extraocular Movement      Right  Left    Full, Ortho Full, Ortho       Neuro/Psych    Oriented x3:  Yes   Mood/Affect:  Normal       Dilation    Both eyes:  1.0% Mydriacyl, 2.5% Phenylephrine @ 8:08 AM        Slit Lamp and Fundus Exam    Slit Lamp Exam      Right Left   Lids/Lashes Dermatochalasis - upper lid Dermatochalasis - upper lid   Conjunctiva/Sclera mild Melanosis mild Melanosis   Cornea 1-2+ Punctate epithelial erosions, mild Arcus 1-2+ Punctate epithelial erosions, mild Arcus   Anterior Chamber Deep and quiet Deep and quiet   Iris Round and dilated, PPM nasally Round and dilated, PPM nasally   Lens 2+ Cortical cataract, 2-3+ Nuclear sclerosis 3+ Cortical cataract, 2-3+ Nuclear sclerosis   Vitreous Vitreous syneresis Vitreous syneresis, trace cell in anterior vit, white fibrotic vitreous band exterting traction along distal IT arcade, otherwise clear       Fundus Exam      Right Left   Disc Pink and Sharp Pink and Sharp, strain of fibrosis eminating from 0800  rim   C/D Ratio 0.5 0.4   Macula Flat, Good foveal reflex, mild Retinal pigment epithelial mottling, No heme or edema Flat, Good foveal reflex, mild Retinal pigment epithelial mottling, No heme or edema   Vessels mild Vascular attenuation mild Vascular attenuation, white fibrotic vitreous band exterting traction along distal IT arcade   Periphery Attached Attached          IMAGING AND PROCEDURES  Imaging and Procedures for '@TODAY'$ @  OCT, Retina - OU - Both Eyes       Right Eye Quality was good. Central Foveal Thickness: 255. Progression has been stable. Findings include normal foveal contour, no IRF, no SRF, vitreomacular adhesion .   Left Eye Quality was good. Central Foveal Thickness: 266. Progression has been stable. Findings include preretinal fibrosis, vitreous traction, normal foveal contour, no IRF, no SRF (preretinal fibrosis w/ traction and IRF at distal IT arcade).   Notes *Images captured and stored on drive  Diagnosis / Impression:  NFP; no IRF/SRF OU centrally OS: preretinal fibrosis w/ traction, IRF, and irregular choroidal contour at distal IT arcade  Clinical management:  See below  Abbreviations: NFP - Normal foveal profile. CME - cystoid macular edema. PED - pigment epithelial detachment. IRF - intraretinal fluid. SRF - subretinal fluid. EZ - ellipsoid zone. ERM - epiretinal membrane. ORA - outer retinal atrophy. ORT - outer retinal tubulation. SRHM - subretinal hyper-reflective material        B-Scan Ultrasound - OS - Left Eye       Quality was good. Findings included vitreous opacities.   Notes **Images stored on drive**  Impression: Large thick hyperechoic vitreous band w/ traction at ~0430 extending anterior to posterior No RD or mass                ASSESSMENT/PLAN:    ICD-10-CM   1. 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 Retinal edema H35.81 OCT, Retina - OU - Both Eyes  3. Left  posterior uveitis H30.92   4. 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 Combined forms of age-related cataract of both eyes H25.813     1. Focal vitreous condensation/fibrosis and traction along distal inf temp arcade, OS - stable today -- no subjective or objective change from prior - pt  remains asymptomatic w/ VA still 20/20 OS - repeat Optos FA today (11.20.19) shows no significant leakage or staining; there is blockage from the vitreous condensation - unclear etiology; pt denies trauma / injury / illness - b-scan ultrasound shows broad hyperechoic band extending from arcades to ora along 430 meridian; no obvious foreign body detected - differential dx includes inflammatory, infectious, idiopathic, congenital or other etiology - limited work up initiated  - CBC, CMP -- essentially wnl  - ANA, ACE, ANCA, CRP, ESR, HLA profile, Quant-Gold, RF, RPR, Toxoplasma Abs -- all wnl  - bacterial and fungal blood cultures -- were not done   - CXR 10.23.19 -- normal - discussed findings - recommend evaluation by Dr. Dwana Melena for second opinion - F/U here 2 months, sooner prn  2. +Retinal edema at tractional site along distal IT arcade  3,4. No posterior uveitis or retinal vasculitis on FA - lab work up as above to r/o inflammatory, infectious etiologies  5. Combined form cataract OU - The symptoms of cataract, surgical options, and treatments and risks were discussed with patient. - discussed diagnosis and progression - not yet visually significant - monitor for now     Ophthalmic Meds Ordered this visit:  No orders of the defined types were placed in this encounter.      Return in about 2 months (around 02/10/2018) for f/u 2 months Vitreous band / condensation.  There are no Patient Instructions on file for this visit.   Explained the diagnoses, plan, and follow up with the patient and they expressed understanding.  Patient expressed understanding of the importance of proper  follow up care.   This document serves as a record of services personally performed by Gardiner Sleeper, MD, PhD. It was created on their behalf by Ernest Mallick, OA, an ophthalmic assistant. The creation of this record is the provider's dictation and/or activities during the visit.    Electronically signed by: Ernest Mallick, OA  11.19.19 2:20 PM    Gardiner Sleeper, M.D., Ph.D. Diseases & Surgery of the Retina and Vitreous [facility]   I have reviewed the above documentation for accuracy and completeness, and I agree with the above. Gardiner Sleeper, M.D., Ph.D. 12/12/17 2:20 PM    Abbreviations: M myopia (nearsighted); A astigmatism; H hyperopia (farsighted); P presbyopia; Mrx spectacle prescription;  CTL contact lenses; OD right eye; OS left eye; OU both eyes  XT exotropia; ET esotropia; PEK punctate epithelial keratitis; PEE punctate epithelial erosions; DES dry eye syndrome; MGD meibomian gland dysfunction; ATs artificial tears; PFAT's preservative free artificial tears; [place] nuclear sclerotic cataract; PSC posterior subcapsular cataract; ERM epi-retinal membrane; PVD posterior vitreous detachment; RD retinal detachment; DM diabetes mellitus; DR diabetic retinopathy; NPDR non-proliferative diabetic retinopathy; PDR proliferative diabetic retinopathy; CSME clinically significant macular edema; DME diabetic macular edema; dbh dot blot hemorrhages; CWS cotton wool spot; POAG primary open angle glaucoma; C/D cup-to-disc ratio; HVF humphrey visual field; GVF goldmann visual field; OCT optical coherence tomography; IOP intraocular pressure; BRVO Branch retinal vein occlusion; CRVO central retinal vein occlusion; CRAO central retinal artery occlusion; BRAO branch retinal artery occlusion; RT retinal tear; SB scleral buckle; PPV pars plana vitrectomy; VH Vitreous hemorrhage; PRP panretinal laser photocoagulation; IVK intravitreal kenalog; VMT vitreomacular traction; MH Macular hole;   NVD neovascularization of the disc; NVE neovascularization elsewhere; AREDS age related eye disease study; ARMD age related macular degeneration; POAG primary open angle glaucoma; EBMD epithelial/anterior basement membrane dystrophy; ACIOL anterior chamber intraocular  lens; IOL intraocular lens; PCIOL posterior chamber intraocular lens; Phaco/IOL phacoemulsification with intraocular lens placement; [place] photorefractive keratectomy; LASIK laser assisted in situ keratomileusis; HTN hypertension; DM diabetes mellitus; COPD chronic obstructive pulmonary disease

## 2017-12-11 ENCOUNTER — Encounter (INDEPENDENT_AMBULATORY_CARE_PROVIDER_SITE_OTHER): Payer: Self-pay | Admitting: Ophthalmology

## 2017-12-11 ENCOUNTER — Ambulatory Visit (INDEPENDENT_AMBULATORY_CARE_PROVIDER_SITE_OTHER): Payer: Self-pay | Admitting: Ophthalmology

## 2017-12-11 DIAGNOSIS — H3581 Retinal edema: Secondary | ICD-10-CM

## 2017-12-11 DIAGNOSIS — H3092 Unspecified chorioretinal inflammation, left eye: Secondary | ICD-10-CM

## 2017-12-11 DIAGNOSIS — H43312 Vitreous membranes and strands, left eye: Secondary | ICD-10-CM

## 2017-12-11 DIAGNOSIS — H25813 Combined forms of age-related cataract, bilateral: Secondary | ICD-10-CM

## 2017-12-11 DIAGNOSIS — H35062 Retinal vasculitis, left eye: Secondary | ICD-10-CM

## 2017-12-12 ENCOUNTER — Encounter (INDEPENDENT_AMBULATORY_CARE_PROVIDER_SITE_OTHER): Payer: Self-pay | Admitting: Ophthalmology

## 2018-02-14 ENCOUNTER — Encounter (INDEPENDENT_AMBULATORY_CARE_PROVIDER_SITE_OTHER): Payer: Self-pay | Admitting: Ophthalmology

## 2018-02-19 NOTE — Progress Notes (Signed)
[facility] Note  02/21/2018     CHIEF COMPLAINT Patient presents for Retina Follow Up   HISTORY OF PRESENT ILLNESS: Samantha Bryant is a 63 y.o. female who presents to the clinic today for:   HPI    Retina Follow Up    Patient presents with  Other.  In left eye.  This started 3 months ago.  Severity is mild.  Duration of 2 months.  Since onset it is stable.  I, the attending physician,  performed the HPI with the patient and updated documentation appropriately.          Comments    63 y/o female pt here for 2 mo f/u for vit condensations/fibrosis+traction along arcades OS.  No change in [place] OU.  Denies pain, flashes, new floaters.  No gtts.       Last edited by Bernarda Caffey, MD on 02/21/2018  3:17 PM. (History)      Referring physician: No referring provider defined for this encounter.  HISTORICAL INFORMATION:   Selected notes from the MEDICAL RECORD NUMBER Referred by Dr. Len Blalock for concern of retinal vasculitis LEE: 10.01.19 Mellody Memos) [BCVA: OD: 20/20- OS: 20/20-] Ocular Hx- PMH-    CURRENT MEDICATIONS: No current outpatient medications on file. (Ophthalmic Drugs)   No current facility-administered medications for this visit.  (Ophthalmic Drugs)   Current Outpatient Medications (Other)  Medication Sig  . atorvastatin (LIPITOR) 20 MG tablet Take 20 mg by mouth every other day.  . naproxen sodium (ANAPROX) 220 MG tablet Take 220 mg by mouth 2 (two) times daily as needed. For pain   No current facility-administered medications for this visit.  (Other)      REVIEW OF SYSTEMS: ROS    Positive for: Eyes   Negative for: Constitutional, Gastrointestinal, Neurological, Skin, Genitourinary, Musculoskeletal, HENT, Endocrine, Cardiovascular, Respiratory, Psychiatric, Allergic/Imm, Heme/Lymph   Last edited by Matthew Folks, COA on 02/21/2018  2:38 PM. (History)       ALLERGIES Allergies  Allergen Reactions  . Hydrocodone      PAST MEDICAL HISTORY History reviewed. No pertinent past medical history. Past Surgical History:  Procedure Laterality Date  . ABDOMINAL HYSTERECTOMY    . CARPAL TUNNEL RELEASE    . KNEE ARTHROSCOPY    . TONSILLECTOMY      FAMILY HISTORY Family History  Problem Relation Age of Onset  . Diabetes Mother   . Glaucoma Mother     SOCIAL HISTORY Social History   Tobacco Use  . Smoking status: Former Smoker    Years: 3.00  . Smokeless tobacco: Never Used  Substance Use Topics  . Alcohol use: No  . Drug use: No         OPHTHALMIC EXAM:  Base Eye Exam    Visual Acuity (Snellen - Linear)      Right Left   Dist cc 20/20 + 20/20   Correction:  Glasses       Tonometry (Tonopen, 2:40 PM)      Right Left   Pressure 14 15       Pupils      Dark Light Shape React APD   Right 4 3 Round Slow None   Left 4 3 Round Slow None       Visual Fields (Counting fingers)      Left Right    Full Full       Extraocular Movement      Right Left  Full, Ortho Full, Ortho       Neuro/Psych    Oriented x3:  Yes   Mood/Affect:  Normal       Dilation    Both eyes:  1.0% Mydriacyl, 2.5% Phenylephrine @ 2:40 PM        Slit Lamp and Fundus Exam    Slit Lamp Exam      Right Left   Lids/Lashes Dermatochalasis - upper lid Dermatochalasis - upper lid   Conjunctiva/Sclera mild Melanosis mild Melanosis   Cornea 1-2+ Punctate epithelial erosions, mild Arcus 1-2+ Punctate epithelial erosions, mild Arcus   Anterior Chamber Deep and quiet Deep and quiet   Iris Round and dilated, PPM nasally Round and dilated, PPM nasally   Lens 2+ Cortical cataract, 2-3+ Nuclear sclerosis 3+ Cortical cataract, 2-3+ Nuclear sclerosis   Vitreous Vitreous syneresis Vitreous syneresis, trace cell in anterior vit, white fibrotic vitreous band exterting traction along distal IT arcade, otherwise clear       Fundus Exam      Right Left   Disc Pink and Sharp Pink and Sharp   C/D Ratio 0.5 0.4    Macula Flat, Good foveal reflex, mild Retinal pigment epithelial mottling, No heme or edema Flat, Good foveal reflex, mild Retinal pigment epithelial mottling, No heme or edema   Vessels mild Vascular attenuation mild Vascular attenuation, white fibrotic vitreous band exerting traction along distal IT arcade   Periphery Attached Attached; focal strand of fibrosis emanating from distal IT arcades to 4 oclock periphery -- no change from prior          IMAGING AND PROCEDURES  Imaging and Procedures for '@TODAY'$ @  OCT, Retina - OU - Both Eyes       Right Eye Quality was good. Central Foveal Thickness: 258. Progression has been stable. Findings include normal foveal contour, no IRF, no SRF, vitreomacular adhesion .   Left Eye Quality was good. Central Foveal Thickness: 269. Progression has been stable. Findings include preretinal fibrosis, vitreous traction, normal foveal contour, no IRF, no SRF (preretinal fibrosis w/ traction and IRF at distal IT arcade).   Notes *Images captured and stored on drive  Diagnosis / Impression:  NFP; no IRF/SRF OU centrally OS: preretinal fibrosis w/ traction, IRF, and irregular choroidal contour at distal IT arcade  Clinical management:  See below  Abbreviations: NFP - Normal foveal profile. CME - cystoid macular edema. PED - pigment epithelial detachment. IRF - intraretinal fluid. SRF - subretinal fluid. EZ - ellipsoid zone. ERM - epiretinal membrane. ORA - outer retinal atrophy. ORT - outer retinal tubulation. SRHM - subretinal hyper-reflective material        Color Fundus Photography Optos - OU - Both Eyes       Right Eye Progression has been stable. Disc findings include normal observations. Macula : normal observations. Vessels : normal observations. Periphery : normal observations.   Left Eye Progression has been stable. Disc findings include normal observations. Macula : normal observations. Vessels : normal observations (Sheathing at  source of vitreous condensation). Periphery : (Stable vitreous condensation emanating from distal IT arcade).   Notes **Images stored on drive**                  ASSESSMENT/PLAN:    ICD-10-CM   1. 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 Retinal edema H35.81 OCT, Retina - OU - Both Eyes  3. Left posterior uveitis H30.92   4.  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 Combined forms of age-related cataract of both eyes H25.813   6. Hypertensive retinopathy of both eyes H35.033     1. Focal vitreous condensation/fibrosis and traction along distal inf temp arcade, OS - stable today -- no subjective or objective change from prior - pt remains asymptomatic w/ VA still 20/20 OS - repeat color optos photo 1.31.2020 -- no significant change - repeat Optos FA (11.20.19) shows no significant leakage or staining; there is blockage from the vitreous condensation - unclear etiology; pt denies trauma / injury / illness - b-scan ultrasound shows broad hyperechoic band extending from arcades to ora along 430 meridian; no obvious foreign body detected - differential dx includes inflammatory, infectious, idiopathic, congenital or other etiology - limited work up initiated -- all normal  - CBC, CMP -- essentially wnl  - ANA, ACE, ANCA, CRP, ESR, HLA profile, Quant-Gold, RF, RPR, Toxoplasma Abs -- all wnl  - bacterial and fungal blood cultures -- were not done   - CXR 10.23.19 -- normal - evaluated by Dr. Dwana Melena who recommended observation - F/U 9-12 months, sooner prn  2. +Retinal edema at tractional site along distal IT arcade  3,4. No posterior uveitis or retinal vasculitis on FA - lab work up as above to r/o inflammatory, infectious etiologies  5. Combined form cataract OU - The symptoms of cataract, surgical options, and treatments and risks were discussed with patient. - discussed diagnosis and progression - not  yet visually significant - monitor for now   Ophthalmic Meds Ordered this visit:  No orders of the defined types were placed in this encounter.      Return for 9-12 mos, Dilated Exam, OCT.  There are no Patient Instructions on file for this visit.   Explained the diagnoses, plan, and follow up with the patient and they expressed understanding.  Patient expressed understanding of the importance of proper follow up care.   This document serves as a record of services personally performed by Gardiner Sleeper, MD, PhD. It was created on their behalf by Ernest Mallick, OA, an ophthalmic assistant. The creation of this record is the provider's dictation and/or activities during the visit.    Electronically signed by: Ernest Mallick, OA  01.29.2020 8:46 AM    Gardiner Sleeper, M.D., Ph.D. Diseases & Surgery of the Retina and Vitreous [facility]  I have reviewed the above documentation for accuracy and completeness, and I agree with the above. Gardiner Sleeper, M.D., Ph.D. 02/24/18 8:46 AM   Abbreviations: M myopia (nearsighted); A astigmatism; H hyperopia (farsighted); P presbyopia; Mrx spectacle prescription;  CTL contact lenses; OD right eye; OS left eye; OU both eyes  XT exotropia; ET esotropia; PEK punctate epithelial keratitis; PEE punctate epithelial erosions; DES dry eye syndrome; MGD meibomian gland dysfunction; ATs artificial tears; PFAT's preservative free artificial tears; [place] nuclear sclerotic cataract; PSC posterior subcapsular cataract; ERM epi-retinal membrane; PVD posterior vitreous detachment; RD retinal detachment; DM diabetes mellitus; DR diabetic retinopathy; NPDR non-proliferative diabetic retinopathy; PDR proliferative diabetic retinopathy; CSME clinically significant macular edema; DME diabetic macular edema; dbh dot blot hemorrhages; CWS cotton wool spot; POAG primary open angle glaucoma; C/D cup-to-disc ratio; HVF humphrey visual field; GVF goldmann  visual field; OCT optical coherence tomography; IOP intraocular pressure; BRVO Branch retinal vein occlusion; CRVO central retinal vein occlusion; CRAO central retinal artery occlusion; BRAO branch retinal artery occlusion; RT retinal tear; SB scleral buckle; PPV pars plana  vitrectomy; VH Vitreous hemorrhage; PRP panretinal laser photocoagulation; IVK intravitreal kenalog; VMT vitreomacular traction; MH Macular hole;  NVD neovascularization of the disc; NVE neovascularization elsewhere; AREDS age related eye disease study; ARMD age related macular degeneration; POAG primary open angle glaucoma; EBMD epithelial/anterior basement membrane dystrophy; ACIOL anterior chamber intraocular lens; IOL intraocular lens; PCIOL posterior chamber intraocular lens; Phaco/IOL phacoemulsification with intraocular lens placement; [place] photorefractive keratectomy; LASIK laser assisted in situ keratomileusis; HTN hypertension; DM diabetes mellitus; COPD chronic obstructive pulmonary disease

## 2018-02-21 ENCOUNTER — Encounter (INDEPENDENT_AMBULATORY_CARE_PROVIDER_SITE_OTHER): Payer: Self-pay | Admitting: Ophthalmology

## 2018-02-21 ENCOUNTER — Ambulatory Visit (INDEPENDENT_AMBULATORY_CARE_PROVIDER_SITE_OTHER): Payer: Self-pay | Admitting: Ophthalmology

## 2018-02-21 DIAGNOSIS — H25813 Combined forms of age-related cataract, bilateral: Secondary | ICD-10-CM

## 2018-02-21 DIAGNOSIS — H43312 Vitreous membranes and strands, left eye: Secondary | ICD-10-CM

## 2018-02-21 DIAGNOSIS — H3092 Unspecified chorioretinal inflammation, left eye: Secondary | ICD-10-CM

## 2018-02-21 DIAGNOSIS — H3581 Retinal edema: Secondary | ICD-10-CM

## 2018-02-21 DIAGNOSIS — H35033 Hypertensive retinopathy, bilateral: Secondary | ICD-10-CM

## 2018-02-21 DIAGNOSIS — H35062 Retinal vasculitis, left eye: Secondary | ICD-10-CM

## 2018-02-25 ENCOUNTER — Encounter (HOSPITAL_COMMUNITY): Payer: Self-pay | Admitting: *Deleted

## 2018-02-25 ENCOUNTER — Encounter (HOSPITAL_COMMUNITY): Payer: Self-pay

## 2018-02-25 ENCOUNTER — Ambulatory Visit (HOSPITAL_COMMUNITY)
Admission: RE | Admit: 2018-02-25 | Discharge: 2018-02-25 | Disposition: A | Discharge status: Home / Self Care | Payer: Self-pay | Source: Ambulatory Visit | Attending: Obstetrics and Gynecology | Admitting: Obstetrics and Gynecology

## 2018-02-25 ENCOUNTER — Ambulatory Visit
Admission: RE | Admit: 2018-02-25 | Discharge: 2018-02-25 | Disposition: A | Discharge status: Home / Self Care | Payer: No Typology Code available for payment source | Source: Ambulatory Visit | Attending: Obstetrics and Gynecology | Admitting: Obstetrics and Gynecology

## 2018-02-25 VITALS — BP 114/72 | Wt 274.0 lb

## 2018-02-25 DIAGNOSIS — Z1231 Encounter for screening mammogram for malignant neoplasm of breast: Secondary | ICD-10-CM

## 2018-02-25 DIAGNOSIS — Z1239 Encounter for other screening for malignant neoplasm of breast: Secondary | ICD-10-CM

## 2018-02-25 NOTE — Patient Instructions (Signed)
Explained breast self awareness with Samantha Bryant. Patient did not need a Pap smear today due to her history of a hysterectomy for benign reasons. Explained to patient that she doesn't need any further Pap smears due to her history of a hysterectomy for benign reasons. Referred patient to the [facility] for a screening mammogram. Appointment scheduled for Tuesday, February 25, 2018 at 1310. Patient aware of appointment and will be there. Let patient know the [facility] Center will follow up with her within the next couple weeks with results of mammogram by letter or phone. Samantha Bryant verbalized understanding.  Maleko Greulich, Kathaleen Maser, RN 12:57 PM

## 2018-02-25 NOTE — Progress Notes (Signed)
No complaints today.   Pap Smear: Pap smear not completed today. Last Pap smear was around 7 years ago at Dr. Karmen Stabs office and normal per patient. Per patient has no history of an abnormal Pap smear. Patient has a history of a hysterectomy in around 1979 or 1980 due to fibroids. Patient doesn't need any further Pap smears due to her history of a hysterectomy for benign reasons. No Pap smear results are in Epic.  Physical exam: Breasts Breasts symmetrical. No skin abnormalities bilateral breasts. No nipple retraction bilateral breasts. No nipple discharge bilateral breasts. No lymphadenopathy. No lumps palpated bilateral breasts. No complaints of pain or tenderness on exam. Referred patient to the [facility] for a screening mammogram. Appointment scheduled for Tuesday, February 25, 2018 at 1310.        Pelvic/Bimanual No Pap smear completed today since patient has a history of a hysterectomy for benign reasons. Pap smear not indicated per BCCCP guidelines.   Smoking History: Patient is a former smoker that quit 7 years ago.  Patient Navigation: Patient education provided. Access to services provided for patient through BCCCP program.   Colorectal Cancer Screening: Per patient had a colonoscopy completed 5 years ago. Per patient completed a FIT Test in October 2019 that was negative. No complaints today.   Breast and Cervical Cancer Risk Assessment: Patient has no family history of breast cancer, known genetic mutations, or radiation treatment to the chest before age 43. Patient has no history of cervical dysplasia, immunocompromised, or DES exposure in-utero.  Risk Assessment    Risk Scores      02/25/2018   Last edited by: Lynnell Dike, LPN   5-year risk: 1.5 %   Lifetime risk: 6.5 %

## 2018-02-26 ENCOUNTER — Other Ambulatory Visit: Payer: Self-pay | Admitting: Obstetrics and Gynecology

## 2018-02-26 DIAGNOSIS — R928 Other abnormal and inconclusive findings on diagnostic imaging of breast: Secondary | ICD-10-CM

## 2018-03-04 ENCOUNTER — Ambulatory Visit
Admission: RE | Admit: 2018-03-04 | Discharge: 2018-03-04 | Disposition: A | Discharge status: Home / Self Care | Payer: No Typology Code available for payment source | Source: Ambulatory Visit | Attending: Obstetrics and Gynecology | Admitting: Obstetrics and Gynecology

## 2018-03-04 ENCOUNTER — Ambulatory Visit
Admission: RE | Admit: 2018-03-04 | Discharge: 2018-03-04 | Disposition: A | Discharge status: Home / Self Care | Payer: Self-pay | Source: Ambulatory Visit | Attending: Obstetrics and Gynecology | Admitting: Obstetrics and Gynecology

## 2018-03-04 ENCOUNTER — Other Ambulatory Visit: Payer: Self-pay | Admitting: Family Medicine

## 2018-03-04 DIAGNOSIS — R928 Other abnormal and inconclusive findings on diagnostic imaging of breast: Secondary | ICD-10-CM

## 2018-03-04 DIAGNOSIS — N6489 Other specified disorders of breast: Secondary | ICD-10-CM

## 2018-03-13 ENCOUNTER — Other Ambulatory Visit: Payer: Self-pay | Admitting: Obstetrics and Gynecology

## 2018-03-13 DIAGNOSIS — N6489 Other specified disorders of breast: Secondary | ICD-10-CM

## 2018-09-03 ENCOUNTER — Other Ambulatory Visit: Payer: Self-pay

## 2018-09-03 ENCOUNTER — Ambulatory Visit: Payer: No Typology Code available for payment source

## 2018-09-03 ENCOUNTER — Ambulatory Visit
Admission: RE | Admit: 2018-09-03 | Discharge: 2018-09-03 | Disposition: A | Discharge status: Home / Self Care | Payer: No Typology Code available for payment source | Source: Ambulatory Visit | Attending: Family Medicine | Admitting: Family Medicine

## 2018-09-03 DIAGNOSIS — N6489 Other specified disorders of breast: Secondary | ICD-10-CM

## 2018-12-22 ENCOUNTER — Encounter (INDEPENDENT_AMBULATORY_CARE_PROVIDER_SITE_OTHER): Payer: Self-pay | Admitting: Ophthalmology

## 2020-02-16 IMAGING — MG DIGITAL DIAGNOSTIC BILATERAL MAMMOGRAM WITH TOMO AND CAD
8 series · 8 of 24 positions shown · non-contrast
Comparison: 02/25/2018 and 03/04/2018.

CLINICAL DATA: Short-term follow-up for bilateral
asymmetries.Patient has no current breast complaints.

EXAM:
DIGITAL DIAGNOSTIC BILATERAL MAMMOGRAM WITH CAD AND TOMO

[R CC synth-2D]
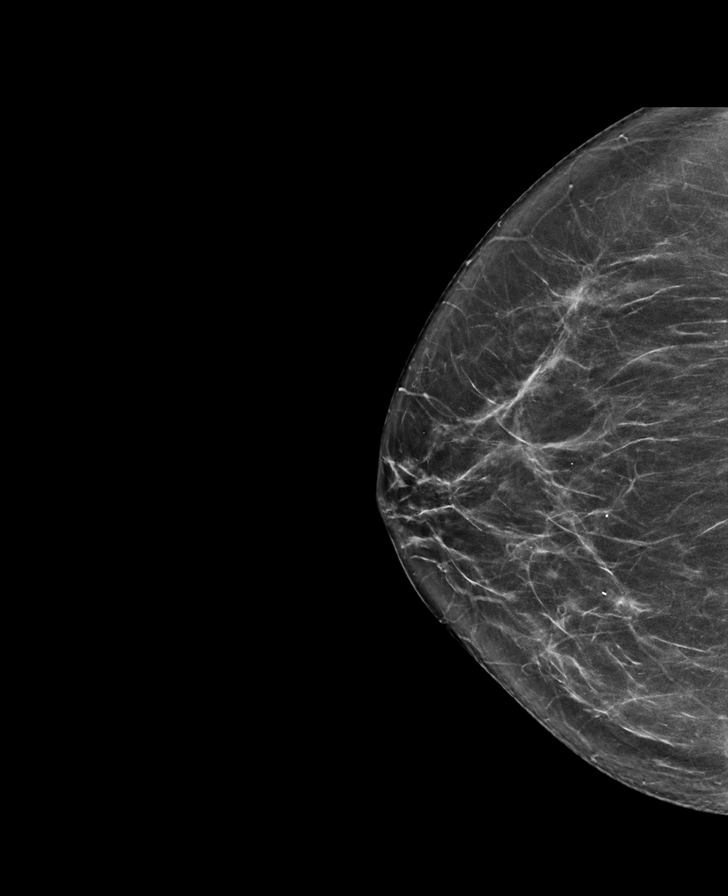

[L MLO synth-2D]
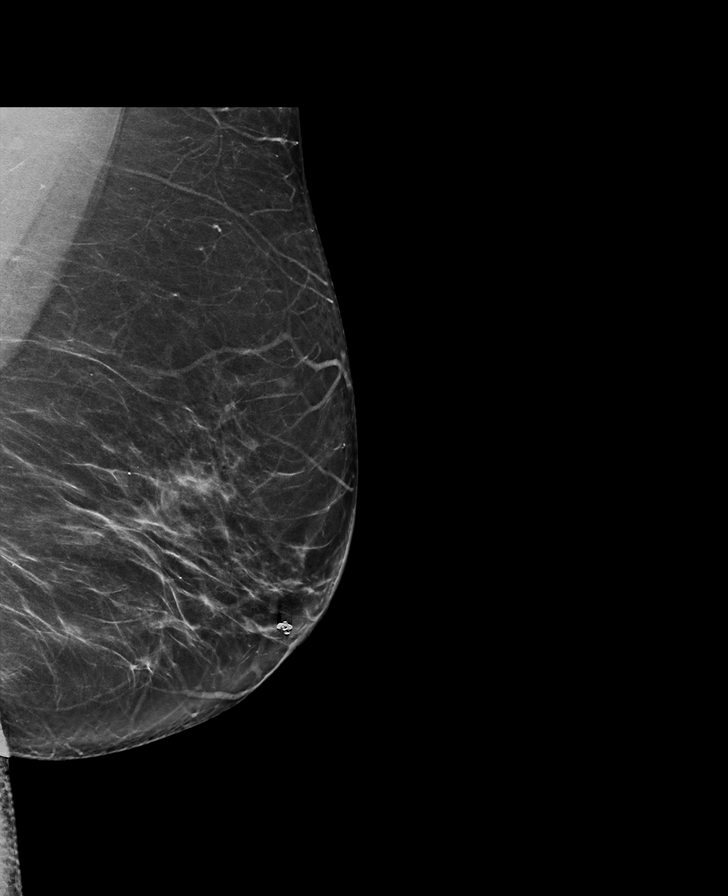

[R MLO synth-2D]
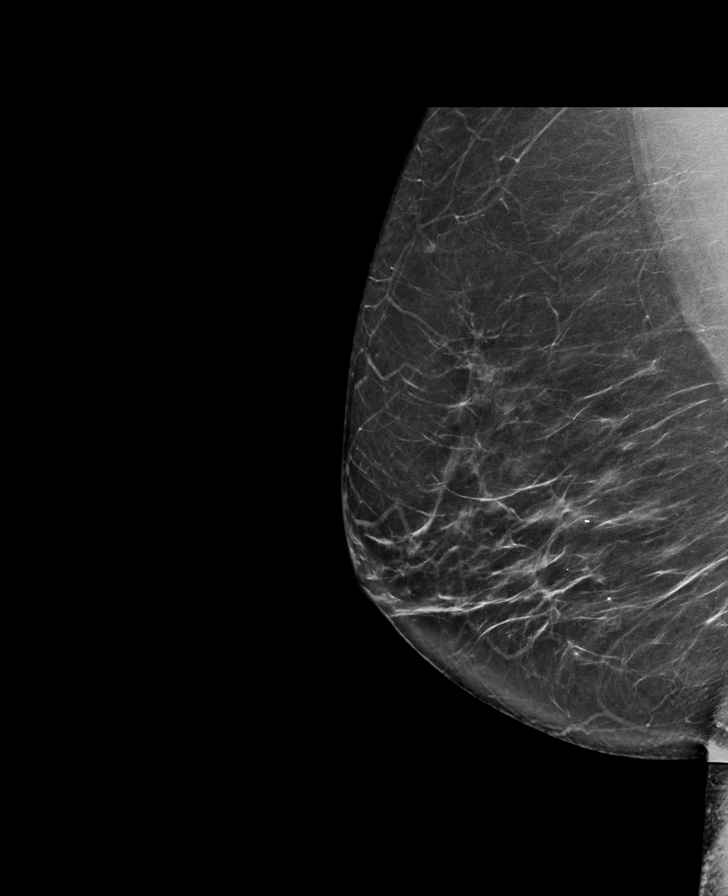

[L CC synth-2D]
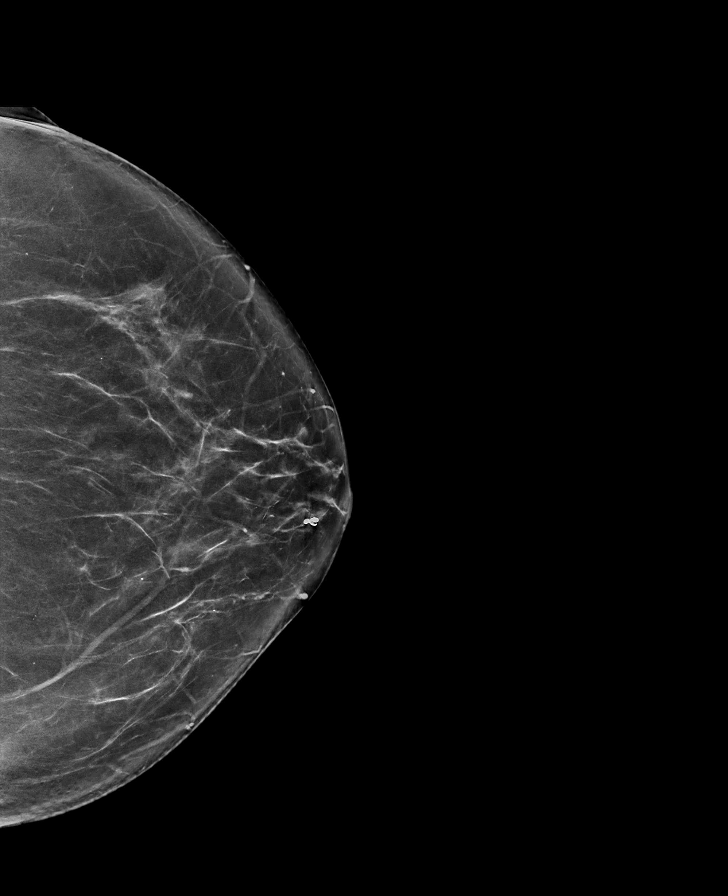

[R CC tomo · tomo slice 37/73.0]
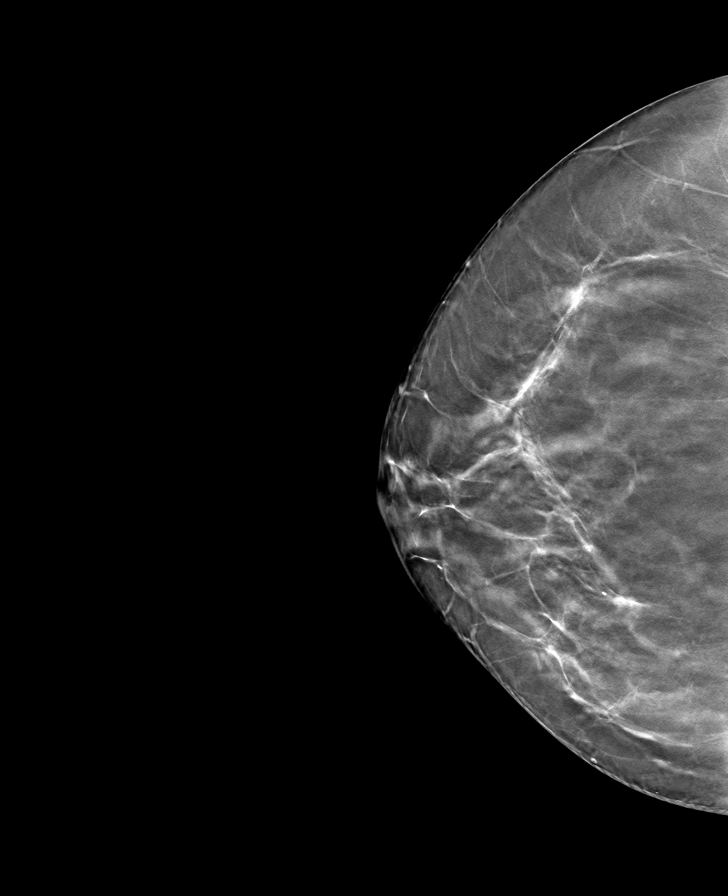

[L MLO tomo · tomo slice 39/78.0]
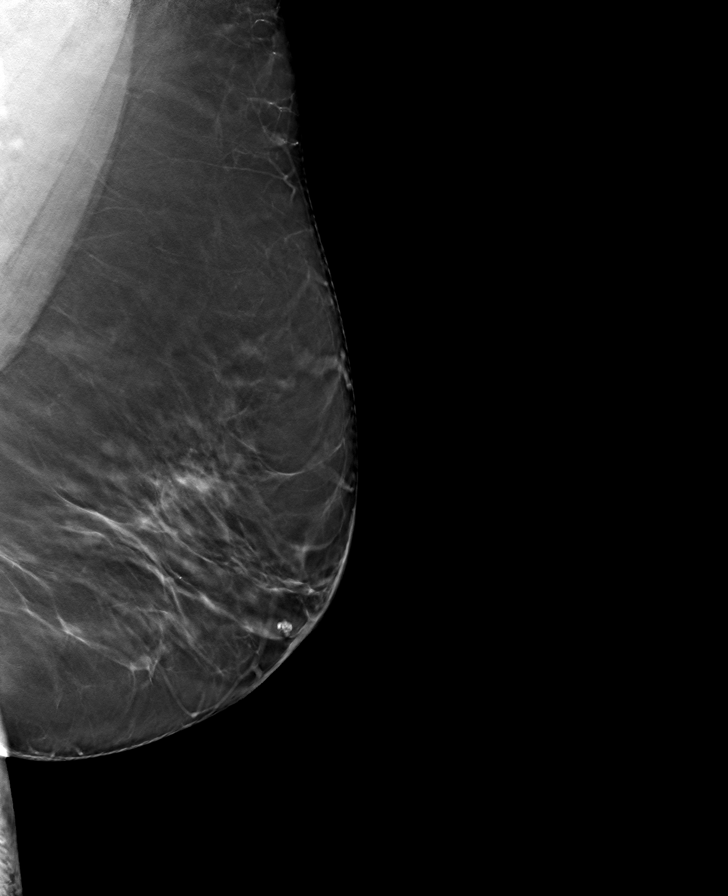

[L CC tomo · tomo slice 39/76.0]
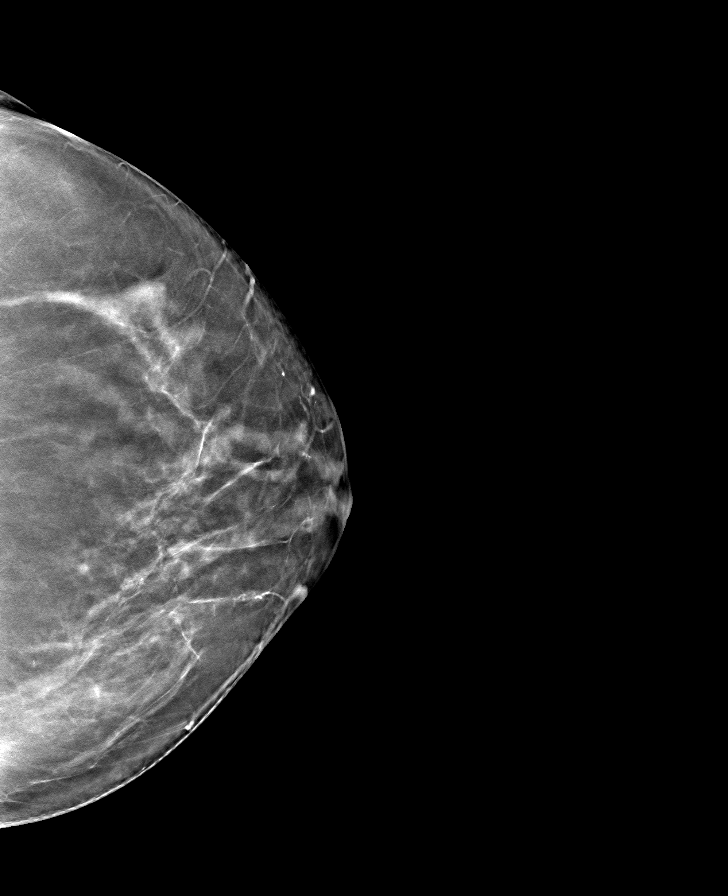

[R MLO tomo · tomo slice 41/81.0]
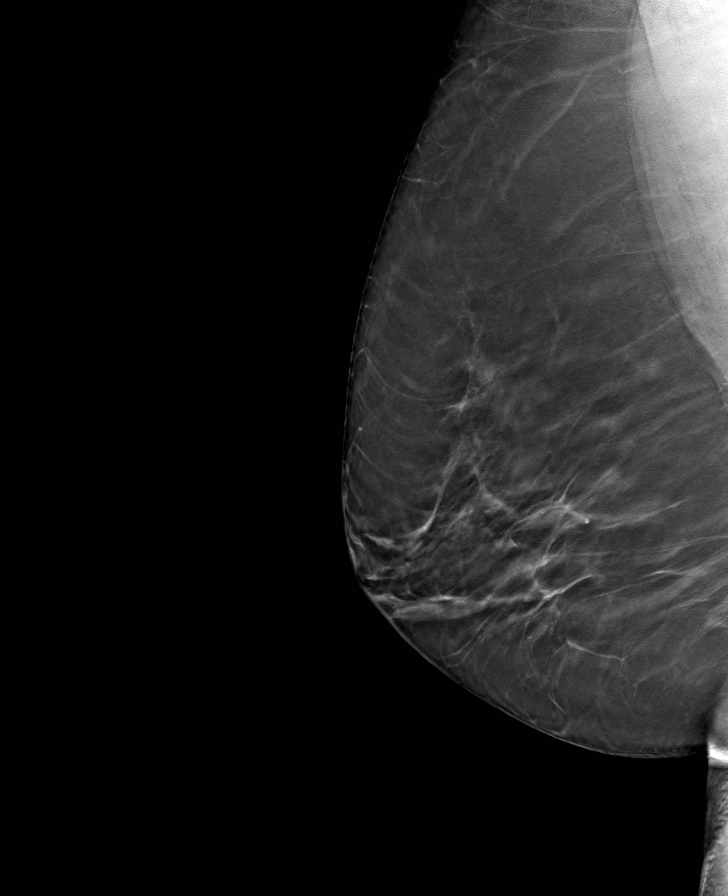

[8 of 24 positions shown; findings below may reference images not displayed]

ACR Breast Density Category b: There are scattered areas of
fibroglandular density.
FINDINGS: The asymmetries noted in the lateral breast on the prior exams are
unchanged. These areas appear is normal superimposed fibroglandular
tissue on tomosynthesis imaging. There are no masses or areas of
architectural distortion. There are no suspicious calcifications.

Mammographic images were processed with CAD.
IMPRESSION: Negative exam.  No evidence of breast malignancy.

RECOMMENDATION:
Screening mammogram in one year.(Code:AU-1-92F)

I have discussed the findings and recommendations with the patient.
Results were also provided in writing at the conclusion of the
visit. If applicable, a reminder letter will be sent to the patient
regarding the next appointment.

BI-RADS CATEGORY  1: Negative.

## 2021-05-25 ENCOUNTER — Other Ambulatory Visit: Payer: Self-pay

## 2021-05-25 ENCOUNTER — Emergency Department (HOSPITAL_COMMUNITY)
Admission: EM | Admit: 2021-05-25 | Discharge: 2021-05-25 | Disposition: A | Discharge status: Home / Self Care | Payer: PRIVATE HEALTH INSURANCE | Attending: Emergency Medicine | Admitting: Emergency Medicine

## 2021-05-25 ENCOUNTER — Encounter (HOSPITAL_COMMUNITY): Payer: Self-pay

## 2021-05-25 ENCOUNTER — Emergency Department (HOSPITAL_COMMUNITY): Payer: PRIVATE HEALTH INSURANCE

## 2021-05-25 DIAGNOSIS — W010XXA Fall on same level from slipping, tripping and stumbling without subsequent striking against object, initial encounter: Secondary | ICD-10-CM | POA: Diagnosis not present

## 2021-05-25 DIAGNOSIS — M546 Pain in thoracic spine: Secondary | ICD-10-CM | POA: Diagnosis not present

## 2021-05-25 DIAGNOSIS — R079 Chest pain, unspecified: Secondary | ICD-10-CM | POA: Diagnosis not present

## 2021-05-25 DIAGNOSIS — W19XXXA Unspecified fall, initial encounter: Secondary | ICD-10-CM

## 2021-05-25 MED ORDER — IBUPROFEN 400 MG PO TABS: 400.0000 mg | ORAL_TABLET | Freq: Three times a day (TID) | ORAL | 0 refills | Status: AC

## 2021-05-25 MED ORDER — OXYCODONE-ACETAMINOPHEN 5-325 MG PO TABS: 1.0000 | ORAL_TABLET | Freq: Three times a day (TID) | ORAL | 0 refills | Status: DC | PRN

## 2021-05-25 NOTE — Discharge Instructions (Signed)
As discussed, it is normal to feel worse in the days immediately following a fall regardless of medication use. ° °However, please take all medication as directed, use ice packs liberally.  If you develop any new, or concerning changes in your condition, please return here for further evaluation and management.   ° °Otherwise, please return followup with your physician ° ° ° °

## 2021-05-25 NOTE — ED Provider Notes (Signed)
? [facility]Provider Note ? ? ?CSN: [id number] ?Arrival date & time: 05/25/21  2054 ? ?  ? ?History ? ?Chief Complaint  ?Patient presents with  ? Fall  ? ? ?Samantha Bryant is a 66 y.o. female. ? ?HPI ?Patient presents with pain in her right chest following a fall that occurred 2 days prior to ED arrival.  She was well, tripped, landed on her right side.  Since that time she has had pain in the right infra and supra mammary regions as well as the superior right parasternal area worse with coughing, motion.  No syncope, no other pain, no fever.  Minimal relief with Tylenol.  She cannot take Vicodin. ?  ? ?Home Medications ?Prior to Admission medications   ?Medication Sig Start Date End Date Taking? Authorizing Provider  ?ibuprofen (ADVIL) 400 MG tablet Take 1 tablet (400 mg total) by mouth 3 (three) times daily for 3 days. Take one tablet three times daily for three days 05/25/21 05/28/21 Yes Gerhard Munch, MD  ?oxyCODONE-acetaminophen (PERCOCET/ROXICET) 5-325 MG tablet Take 1 tablet by mouth 3 (three) times daily as needed for severe pain. 05/25/21  Yes Gerhard Munch, MD  ?atorvastatin (LIPITOR) 20 MG tablet Take 20 mg by mouth every other day.    [provider]  ?naproxen sodium (ANAPROX) 220 MG tablet Take 220 mg by mouth 2 (two) times daily as needed. For pain    [provider]  ?   ? ?Allergies    ?Hydrocodone   ? ?Review of Systems   ?Review of Systems  ?Constitutional:   ?     Per HPI, otherwise negative  ?HENT:    ?     Per HPI, otherwise negative  ?Respiratory:    ?     Per HPI, otherwise negative  ?Cardiovascular:   ?     Per HPI, otherwise negative  ?Gastrointestinal:  Negative for vomiting.  ?Endocrine:  ?     Negative aside from HPI  ?Genitourinary:   ?     Neg aside from HPI   ?Musculoskeletal:   ?     Per HPI, otherwise negative  ?Skin: Negative.   ?Neurological:  Negative for syncope.  ? ?Physical Exam ?Updated Vital Signs ?BP (!) 125/94 (BP Location:  Left Arm)   Pulse 63   Temp (!) 97.5 ?F (36.4 ?C) (Oral)   Resp 18   SpO2 96%  ?Physical Exam ?Vitals and nursing note reviewed.  ?Constitutional:   ?   General: She is not in acute distress. ?   Appearance: She is well-developed. She is obese. She is not ill-appearing, toxic-appearing or diaphoretic.  ?HENT:  ?   Head: Normocephalic and atraumatic.  ?Eyes:  ?   Conjunctiva/sclera: Conjunctivae normal.  ?Pulmonary:  ?   Effort: Pulmonary effort is normal. No respiratory distress.  ?   Breath sounds: No stridor.  ?Abdominal:  ?   General: There is no distension.  ?Skin: ?   General: Skin is warm and dry.  ?Neurological:  ?   Mental Status: She is alert and oriented to person, place, and time.  ?   Cranial Nerves: No cranial nerve deficit.  ?Psychiatric:     ?   Mood and Affect: Mood normal.  ? ? ?ED Results / Procedures / Treatments   ?Labs ?(all labs ordered are listed, but only abnormal results are displayed) ?Labs Reviewed - No data to display ? ?EKG ?None ? ?Radiology ?DG Chest 2 View ? ?Result  Date: 05/25/2021 ?CLINICAL DATA:  Recent fall with chest pain, initial encounter EXAM: CHEST - 2 VIEW COMPARISON:  11/13/2017 FINDINGS: Cardiac shadow is within limits. The lungs are well aerated bilaterally. No focal infiltrate or effusion is seen. No acute fracture is noted. IMPRESSION: No active cardiopulmonary disease. Electronically Signed   By: Alcide Clever M.D.   On: 05/25/2021 21:17   ? ?Procedures ?Procedures  ? ? ?Medications Ordered in ED ?Medications - No data to display ? ?ED Course/ Medical Decision Making/ A&P ? ? ?This patient with a Hx of generally well, but obese elderly female presents to the ED for concern of fall with thoracic pain, this involves an extensive number of treatment options, and is a complaint that carries with it a high risk of complications and morbidity.   ? ?The differential diagnosis includes rib fracture, musculoskeletal injury, pneumothorax ? ? ?Social Determinants of  Health: ? ?Obesity age ? ? ? ?After the initial evaluation, orders, including: X-ray were initiated. ? ? ?Patient placed on Cardiac and Pulse-Oximetry Monitors. ?The patient was maintained on a cardiac monitor.  The cardiac monitored showed an rhythm of 65 sinus normal ?The patient was also maintained on pulse oximetry. The readings were typically 100% room air normal ? ? ?On repeat evaluation of the patient stayed the same ? ?Imaging Studies ordered: ? ?I independently visualized and interpreted imaging which showed no obviously broken rib, no pneumothorax, no pneumonia ?I agree with the radiologist interpretation ? ? ?Dispostion / Final MDM: ? ?After consideration of the diagnostic results and the patient's response to treatment, patient discharged.  She and I discussed possibilities for her pain including muscle strain versus occult fracture.  With reassuring x-ray, no new oxygen requirement, patient started on medication, after she reiterated she cannot take Vicodin, requested Percocet. ? ? ?Final Clinical Impression(s) / ED Diagnoses ?Final diagnoses:  ?Fall, initial encounter  ? ? ?Rx / DC Orders ?ED Discharge Orders   ? ?      Ordered  ?  ibuprofen (ADVIL) 400 MG tablet  3 times daily       ? 05/25/21 2225  ?  oxyCODONE-acetaminophen (PERCOCET/ROXICET) 5-325 MG tablet  3 times daily PRN       ? 05/25/21 2225  ? ?  ?  ? ?  ? ? ?  ?Gerhard Munch, MD ?05/25/21 2237 ? ?

## 2021-05-25 NOTE — ED Triage Notes (Signed)
Patient had a fall on Tuesday, she said she was moving too fast and lost her balance. No LOC, no blood thinners. She said she needs a chest xray to make sure she did not crack a rib. She said it hurts above and below the right breast.  ?

## 2021-10-09 ENCOUNTER — Ambulatory Visit
Admission: RE | Admit: 2021-10-09 | Discharge: 2021-10-09 | Disposition: A | Discharge status: Home / Self Care | Payer: PRIVATE HEALTH INSURANCE | Source: Ambulatory Visit | Attending: Family Medicine | Admitting: Family Medicine

## 2021-10-09 ENCOUNTER — Other Ambulatory Visit: Payer: Self-pay | Admitting: Family Medicine

## 2021-10-09 DIAGNOSIS — M25562 Pain in left knee: Secondary | ICD-10-CM

## 2022-09-16 ENCOUNTER — Other Ambulatory Visit: Payer: Self-pay

## 2022-09-16 ENCOUNTER — Emergency Department (HOSPITAL_COMMUNITY): Payer: No Typology Code available for payment source

## 2022-09-16 ENCOUNTER — Emergency Department (HOSPITAL_COMMUNITY)
Admission: EM | Admit: 2022-09-16 | Discharge: 2022-09-16 | Disposition: A | Discharge status: Home / Self Care | Payer: No Typology Code available for payment source | Attending: Emergency Medicine | Admitting: Emergency Medicine

## 2022-09-16 ENCOUNTER — Encounter (HOSPITAL_COMMUNITY): Payer: Self-pay

## 2022-09-16 DIAGNOSIS — R059 Cough, unspecified: Secondary | ICD-10-CM | POA: Insufficient documentation

## 2022-09-16 DIAGNOSIS — R0602 Shortness of breath: Secondary | ICD-10-CM | POA: Insufficient documentation

## 2022-09-16 LAB — BASIC METABOLIC PANEL
Anion gap: 12 (ref 5–15)
BUN: 12 mg/dL (ref 8–23)
CO2: 23 mmol/L (ref 22–32)
Calcium: 9.1 mg/dL (ref 8.9–10.3)
Chloride: 102 mmol/L (ref 98–111)
Creatinine, Ser: 0.84 mg/dL (ref 0.44–1.00)
GFR, Estimated: >60 mL/min (ref >60)
Glucose, Bld: 96 mg/dL (ref 70–99)
Potassium: 3.4 mmol/L — ABNORMAL LOW (ref 3.5–5.1)
Sodium: 137 mmol/L (ref 135–145)

## 2022-09-16 LAB — CBC
HCT: 39.7 % (ref 36.0–46.0)
Hemoglobin: 13.1 g/dL (ref 12.0–15.0)
MCH: 29.2 pg (ref 26.0–34.0)
MCHC: 33 g/dL (ref 30.0–36.0)
MCV: 88.6 fL (ref 80.0–100.0)
Platelets: 206 10*3/uL (ref 150–400)
RBC: 4.48 MIL/uL (ref 3.87–5.11)
RDW: 13 % (ref 11.5–15.5)
WBC: 5.4 10*3/uL (ref 4.0–10.5)
nRBC: 0 % (ref 0.0–0.2)

## 2022-09-16 LAB — BRAIN NATRIURETIC PEPTIDE: B Natriuretic Peptide: 24.2 pg/mL (ref 0.0–100.0)

## 2022-09-16 LAB — D-DIMER, QUANTITATIVE: D-Dimer, Quant: 0.29 ug{FEU}/mL (ref 0.00–0.50)

## 2022-09-16 MED ORDER — GUAIFENESIN ER 1200 MG PO TB12: 1.0000 | ORAL_TABLET | Freq: Two times a day (BID) | ORAL | 0 refills | Status: DC

## 2022-09-16 MED ORDER — BENZONATATE 100 MG PO CAPS: 100.0000 mg | ORAL_CAPSULE | Freq: Three times a day (TID) | ORAL | 0 refills | Status: DC

## 2022-09-16 NOTE — ED Provider Notes (Signed)
[place] EMERGENCY DEPARTMENT AT [facility] Provider Note   CSN: [id number] Arrival date & time: 09/16/22  1622     History  Chief Complaint  Patient presents with   Shortness of Breath    Samantha Bryant is a 67 y.o. female.   Shortness of Breath    Pt states she was diagnosed with covid last week.  Pt states she had been coughing and congested.  She feels fine when she is sitting but when she stands up and walks she gets short of breath.  No cp, no fevers.  NO leg swelling.   Home Medications Prior to Admission medications   Medication Sig Start Date End Date Taking? Authorizing Provider  benzonatate (TESSALON) 100 MG capsule Take 1 capsule (100 mg total) by mouth every 8 (eight) hours. 09/16/22  Yes Linwood Dibbles, MD  Guaifenesin 1200 MG TB12 Take 1 tablet (1,200 mg total) by mouth 2 (two) times daily at 10 AM and 5 PM. 09/16/22  Yes Linwood Dibbles, MD  atorvastatin (LIPITOR) 20 MG tablet Take 20 mg by mouth every other day.    [provider]  naproxen sodium (ANAPROX) 220 MG tablet Take 220 mg by mouth 2 (two) times daily as needed. For pain    [provider]  oxyCODONE-acetaminophen (PERCOCET/ROXICET) 5-325 MG tablet Take 1 tablet by mouth 3 (three) times daily as needed for severe pain. 05/25/21   Gerhard Munch, MD      Allergies    Hydrocodone    Review of Systems   Review of Systems  Respiratory:  Positive for shortness of breath.     Physical Exam Updated Vital Signs BP 130/64   Pulse (!) 58   Temp 97.7 F (36.5 C) (Oral)   Resp 20   Ht 1.651 m (5\' 5" )   Wt 117.9 kg   SpO2 100%   BMI 43.27 kg/m  Physical Exam Vitals and nursing note reviewed.  Constitutional:      General: She is not in acute distress.    Appearance: She is well-developed.  HENT:     Head: Normocephalic and atraumatic.     Right Ear: External ear normal.     Left Ear: External ear normal.  Eyes:     General: No scleral icterus.       Right eye: No  discharge.        Left eye: No discharge.     Conjunctiva/sclera: Conjunctivae normal.  Neck:     Trachea: No tracheal deviation.  Cardiovascular:     Rate and Rhythm: Normal rate and regular rhythm.  Pulmonary:     Effort: Pulmonary effort is normal. No respiratory distress.     Breath sounds: Normal breath sounds. No stridor. No wheezing or rales.  Abdominal:     General: Bowel sounds are normal. There is no distension.     Palpations: Abdomen is soft.     Tenderness: There is no abdominal tenderness. There is no guarding or rebound.  Musculoskeletal:        General: No tenderness or deformity.     Cervical back: Neck supple.     Right lower leg: No tenderness. No edema.     Left lower leg: No tenderness. No edema.     Comments: Lipoma upper back  Skin:    General: Skin is warm and dry.     Findings: No rash.  Neurological:     General: No focal deficit present.     Mental  Status: She is alert.     Cranial Nerves: No cranial nerve deficit, dysarthria or facial asymmetry.     Sensory: No sensory deficit.     Motor: No abnormal muscle tone or seizure activity.     Coordination: Coordination normal.  Psychiatric:        Mood and Affect: Mood normal.     ED Results / Procedures / Treatments   Labs (all labs ordered are listed, but only abnormal results are displayed) Labs Reviewed  BASIC METABOLIC PANEL - Abnormal; Notable for the following components:      Result Value   Potassium 3.4 (*)    All other components within normal limits  CBC  BRAIN NATRIURETIC PEPTIDE  D-DIMER, QUANTITATIVE    EKG EKG Interpretation Date/Time:  Sunday September 16 2022 18:05:51 EDT Ventricular Rate:  61 PR Interval:    QRS Duration:  158 QT Interval:  462 QTC Calculation: 466 R Axis:   4  Text Interpretation: Poor data quality , probable sinus rhythm Confirmed by Linwood Dibbles [phone]) on 09/16/2022 6:23:52 PM  Radiology DG Chest 2 View  Result Date: 09/16/2022 CLINICAL DATA:  Cough,  congestion and shortness of breath. EXAM: CHEST - 2 VIEW COMPARISON:  May 25, 2021 FINDINGS: The heart size and mediastinal contours are within normal limits. Both lungs are clear. Multilevel degenerative changes are seen throughout the thoracic spine. IMPRESSION: No active cardiopulmonary disease. Electronically Signed   By: Aram Candela M.D.   On: 09/16/2022 19:20    Procedures Procedures    Medications Ordered in ED Medications - No data to display  ED Course/ Medical Decision Making/ A&P Clinical Course as of 09/16/22 1947  Sun Sep 16, 2022  1843 D-dimer negative [JK]  1936 Chest x-ray without acute abnormality.  CBC metabolic panel BNP and D-dimer negative [JK]    Clinical Course User Index [JK] Linwood Dibbles, MD                                 Medical Decision Making Frontal diagnosis includes but not limited to pneumonia pneumothorax congestive heart failure, embolism  Problems Addressed: SOB (shortness of breath): acute illness or injury that poses a threat to life or bodily functions  Amount and/or Complexity of Data Reviewed Labs: ordered. Decision-making details documented in ED Course. Radiology: ordered and independent interpretation performed.  Risk OTC drugs. Prescription drug management.   Patient presented with complaints of shortness of breath.  Patient tested positive for COVID about a week ago.  Her ED workup is reassuring.  No evidence of pneumonia or pneumothorax.  D-dimer is negative.  She is not tachypneic or tachycardic.  I doubt pulmonary embolism.  Patient does not have any findings to suggest CHF exacerbation.  Vital signs here are reassuring.  No evidence of bronchospasm on exam.  I suspect her symptoms could be related to her recent COVID infection.  Will have her try decongestions discussed outpatient follow-up to be rechecked. Evaluation and diagnostic testing in the emergency department does not suggest an emergent condition requiring admission or  immediate intervention beyond what has been performed at this time.  The patient is safe for discharge and has been instructed to return immediately for worsening symptoms, change in symptoms or any other concerns.       Final Clinical Impression(s) / ED Diagnoses Final diagnoses:  SOB (shortness of breath)    Rx / DC Orders ED Discharge Orders  Ordered    Guaifenesin 1200 MG TB12  2 times daily        09/16/22 1946    benzonatate (TESSALON) 100 MG capsule  Every 8 hours        09/16/22 1946              Linwood Dibbles, MD 09/16/22 1949

## 2022-09-16 NOTE — ED Triage Notes (Signed)
"  Have been sick with cough and congestion for a little over a week, tested positive for covid at first but then tested negative on Thursday, now I am just having shortness of breath with little exertion" per pt

## 2022-09-16 NOTE — Discharge Instructions (Signed)
Take the medications to help with your symptoms.  Will test today in the ED were reassuring.  No signs of pneumonia, blood clot or trouble with your heart.  Follow-up with a primary care doctor to be rechecked if symptoms do not resolve in the next week.  Return as needed.  Symptoms.

## 2022-10-08 ENCOUNTER — Inpatient Hospital Stay (HOSPITAL_COMMUNITY): Payer: No Typology Code available for payment source

## 2022-10-08 ENCOUNTER — Emergency Department (HOSPITAL_COMMUNITY): Payer: No Typology Code available for payment source

## 2022-10-08 ENCOUNTER — Encounter (HOSPITAL_COMMUNITY): Payer: Self-pay

## 2022-10-08 ENCOUNTER — Inpatient Hospital Stay (HOSPITAL_COMMUNITY)
Admission: EM | Admit: 2022-10-08 | Discharge: 2022-10-13 | DRG: 286 | Disposition: A | Discharge status: Home / Self Care | Payer: Self-pay | Attending: Student | Admitting: Student

## 2022-10-08 ENCOUNTER — Other Ambulatory Visit: Payer: Self-pay

## 2022-10-08 DIAGNOSIS — I2489 Other forms of acute ischemic heart disease: Secondary | ICD-10-CM | POA: Diagnosis present

## 2022-10-08 DIAGNOSIS — I509 Heart failure, unspecified: Principal | ICD-10-CM

## 2022-10-08 DIAGNOSIS — Z1152 Encounter for screening for COVID-19: Secondary | ICD-10-CM

## 2022-10-08 DIAGNOSIS — I5021 Acute systolic (congestive) heart failure: Secondary | ICD-10-CM | POA: Diagnosis present

## 2022-10-08 DIAGNOSIS — I272 Pulmonary hypertension, unspecified: Secondary | ICD-10-CM | POA: Diagnosis present

## 2022-10-08 DIAGNOSIS — J208 Acute bronchitis due to other specified organisms: Secondary | ICD-10-CM | POA: Diagnosis present

## 2022-10-08 DIAGNOSIS — E876 Hypokalemia: Secondary | ICD-10-CM | POA: Diagnosis present

## 2022-10-08 DIAGNOSIS — Z8616 Personal history of COVID-19: Secondary | ICD-10-CM

## 2022-10-08 DIAGNOSIS — Z79899 Other long term (current) drug therapy: Secondary | ICD-10-CM

## 2022-10-08 DIAGNOSIS — Z7984 Long term (current) use of oral hypoglycemic drugs: Secondary | ICD-10-CM

## 2022-10-08 DIAGNOSIS — N179 Acute kidney failure, unspecified: Secondary | ICD-10-CM | POA: Diagnosis present

## 2022-10-08 DIAGNOSIS — Z83511 Family history of glaucoma: Secondary | ICD-10-CM

## 2022-10-08 DIAGNOSIS — Z885 Allergy status to narcotic agent status: Secondary | ICD-10-CM

## 2022-10-08 DIAGNOSIS — I10 Essential (primary) hypertension: Secondary | ICD-10-CM | POA: Insufficient documentation

## 2022-10-08 DIAGNOSIS — I700 Atherosclerosis of aorta: Secondary | ICD-10-CM | POA: Diagnosis present

## 2022-10-08 DIAGNOSIS — D649 Anemia, unspecified: Secondary | ICD-10-CM | POA: Diagnosis present

## 2022-10-08 DIAGNOSIS — R0609 Other forms of dyspnea: Secondary | ICD-10-CM | POA: Diagnosis present

## 2022-10-08 DIAGNOSIS — I110 Hypertensive heart disease with heart failure: Principal | ICD-10-CM | POA: Diagnosis present

## 2022-10-08 DIAGNOSIS — I3139 Other pericardial effusion (noninflammatory): Secondary | ICD-10-CM | POA: Diagnosis present

## 2022-10-08 DIAGNOSIS — R0902 Hypoxemia: Secondary | ICD-10-CM | POA: Diagnosis present

## 2022-10-08 DIAGNOSIS — T502X5A Adverse effect of carbonic-anhydrase inhibitors, benzothiadiazides and other diuretics, initial encounter: Secondary | ICD-10-CM | POA: Diagnosis present

## 2022-10-08 DIAGNOSIS — E785 Hyperlipidemia, unspecified: Secondary | ICD-10-CM | POA: Diagnosis present

## 2022-10-08 DIAGNOSIS — Z6841 Body Mass Index (BMI) 40.0 and over, adult: Secondary | ICD-10-CM

## 2022-10-08 DIAGNOSIS — Z833 Family history of diabetes mellitus: Secondary | ICD-10-CM

## 2022-10-08 DIAGNOSIS — R7989 Other specified abnormal findings of blood chemistry: Secondary | ICD-10-CM

## 2022-10-08 DIAGNOSIS — Z87891 Personal history of nicotine dependence: Secondary | ICD-10-CM

## 2022-10-08 HISTORY — DX: Essential (primary) hypertension: I10

## 2022-10-08 LAB — COMPREHENSIVE METABOLIC PANEL
ALT: 18 U/L (ref 0–44)
AST: 20 U/L (ref 15–41)
Albumin: 3.9 g/dL (ref 3.5–5.0)
Alkaline Phosphatase: 57 U/L (ref 38–126)
Anion gap: 10 (ref 5–15)
BUN: 8 mg/dL (ref 8–23)
CO2: 25 mmol/L (ref 22–32)
Calcium: 8.7 mg/dL — ABNORMAL LOW (ref 8.9–10.3)
Chloride: 108 mmol/L (ref 98–111)
Creatinine, Ser: 0.7 mg/dL (ref 0.44–1.00)
GFR, Estimated: >60 mL/min (ref >60)
Glucose, Bld: 110 mg/dL — ABNORMAL HIGH (ref 70–99)
Potassium: 3.7 mmol/L (ref 3.5–5.1)
Sodium: 143 mmol/L (ref 135–145)
Total Bilirubin: 0.8 mg/dL (ref 0.3–1.2)
Total Protein: 6.6 g/dL (ref 6.5–8.1)

## 2022-10-08 LAB — RESP PANEL BY RT-PCR (RSV, FLU A&B, COVID)  RVPGX2
Influenza A by PCR: NEGATIVE
Influenza B by PCR: NEGATIVE
Resp Syncytial Virus by PCR: NEGATIVE
SARS Coronavirus 2 by RT PCR: NEGATIVE

## 2022-10-08 LAB — CBC WITH DIFFERENTIAL/PLATELET
Abs Immature Granulocytes: 0.02 10*3/uL (ref 0.00–0.07)
Basophils Absolute: 0 10*3/uL (ref 0.0–0.1)
Basophils Relative: 0 %
Eosinophils Absolute: 0.2 10*3/uL (ref 0.0–0.5)
Eosinophils Relative: 3 %
HCT: 32.1 % — ABNORMAL LOW (ref 36.0–46.0)
Hemoglobin: 10.3 g/dL — ABNORMAL LOW (ref 12.0–15.0)
Immature Granulocytes: 0 %
Lymphocytes Relative: 42 %
Lymphs Abs: 2.8 10*3/uL (ref 0.7–4.0)
MCH: 29.9 pg (ref 26.0–34.0)
MCHC: 32.1 g/dL (ref 30.0–36.0)
MCV: 93 fL (ref 80.0–100.0)
Monocytes Absolute: 0.5 10*3/uL (ref 0.1–1.0)
Monocytes Relative: 7 %
Neutro Abs: 3.1 10*3/uL (ref 1.7–7.7)
Neutrophils Relative %: 48 %
Platelets: 214 10*3/uL (ref 150–400)
RBC: 3.45 MIL/uL — ABNORMAL LOW (ref 3.87–5.11)
RDW: 14.6 % (ref 11.5–15.5)
WBC: 6.6 10*3/uL (ref 4.0–10.5)
nRBC: 0 % (ref 0.0–0.2)

## 2022-10-08 LAB — ECHOCARDIOGRAM COMPLETE
Area-P 1/2: 3.72 cm2
Calc EF: 37 %
Height: 65 in
S' Lateral: 4 cm
Single Plane A2C EF: 29.8 %
Single Plane A4C EF: 39.6 %
Weight: 4160 [oz_av]

## 2022-10-08 LAB — BRAIN NATRIURETIC PEPTIDE: B Natriuretic Peptide: 904.2 pg/mL — ABNORMAL HIGH (ref 0.0–100.0)

## 2022-10-08 LAB — TROPONIN I (HIGH SENSITIVITY)
Troponin I (High Sensitivity): 118 ng/L (ref <18)
Troponin I (High Sensitivity): 120 ng/L (ref <18)

## 2022-10-08 LAB — LIPID PANEL
Cholesterol: 255 mg/dL — ABNORMAL HIGH (ref 0–200)
HDL: 55 mg/dL (ref >40)
LDL Cholesterol: 183 mg/dL — ABNORMAL HIGH (ref 0–99)
Total CHOL/HDL Ratio: 4.6 ratio
Triglycerides: 87 mg/dL (ref <150)
VLDL: 17 mg/dL (ref 0–40)

## 2022-10-08 LAB — HIV ANTIBODY (ROUTINE TESTING W REFLEX): HIV Screen 4th Generation wRfx: NONREACTIVE

## 2022-10-08 MED ORDER — ENOXAPARIN SODIUM 60 MG/0.6ML IJ SOSY
60.0000 mg | PREFILLED_SYRINGE | INTRAMUSCULAR | Status: DC
  Administered 2022-10-08 – 2022-10-13 (×6): 60 mg via SUBCUTANEOUS
  Filled 2022-10-08 (×6): qty 0.6

## 2022-10-08 MED ORDER — ACETAMINOPHEN 325 MG PO TABS
650.0000 mg | ORAL_TABLET | Freq: Four times a day (QID) | ORAL | Status: DC | PRN
  Administered 2022-10-08 – 2022-10-12 (×6): 650 mg via ORAL
  Filled 2022-10-08 (×6): qty 2

## 2022-10-08 MED ORDER — ALBUTEROL SULFATE (2.5 MG/3ML) 0.083% IN NEBU: 2.5000 mg | INHALATION_SOLUTION | RESPIRATORY_TRACT | Status: DC | PRN

## 2022-10-08 MED ORDER — TRAZODONE HCL 50 MG PO TABS: 25.0000 mg | ORAL_TABLET | Freq: Every evening | ORAL | Status: DC | PRN

## 2022-10-08 MED ORDER — FUROSEMIDE 10 MG/ML IJ SOLN
20.0000 mg | Freq: Once | INTRAMUSCULAR | Status: AC
  Administered 2022-10-08: 20 mg via INTRAVENOUS
  Filled 2022-10-08: qty 4

## 2022-10-08 MED ORDER — FUROSEMIDE 10 MG/ML IJ SOLN
20.0000 mg | Freq: Two times a day (BID) | INTRAMUSCULAR | Status: DC
  Administered 2022-10-08 – 2022-10-09 (×2): 20 mg via INTRAVENOUS
  Filled 2022-10-08 (×2): qty 2

## 2022-10-08 MED ORDER — ACETAMINOPHEN 650 MG RE SUPP: 650.0000 mg | Freq: Four times a day (QID) | RECTAL | Status: DC | PRN

## 2022-10-08 MED ORDER — ONDANSETRON HCL 4 MG/2ML IJ SOLN
4.0000 mg | Freq: Four times a day (QID) | INTRAMUSCULAR | Status: DC | PRN
  Administered 2022-10-12: 4 mg via INTRAVENOUS
  Filled 2022-10-08: qty 2

## 2022-10-08 MED ORDER — BENZONATATE 100 MG PO CAPS
100.0000 mg | ORAL_CAPSULE | Freq: Three times a day (TID) | ORAL | Status: DC | PRN
  Administered 2022-10-11: 100 mg via ORAL
  Filled 2022-10-08: qty 1

## 2022-10-08 MED ORDER — ROSUVASTATIN CALCIUM 20 MG PO TABS
40.0000 mg | ORAL_TABLET | Freq: Every day | ORAL | Status: DC
  Administered 2022-10-08 – 2022-10-12 (×5): 40 mg via ORAL
  Filled 2022-10-08 (×5): qty 2

## 2022-10-08 MED ORDER — ONDANSETRON HCL 4 MG PO TABS: 4.0000 mg | ORAL_TABLET | Freq: Four times a day (QID) | ORAL | Status: DC | PRN

## 2022-10-08 MED ORDER — ROSUVASTATIN CALCIUM 20 MG PO TABS: 10.0000 mg | ORAL_TABLET | ORAL | Status: DC

## 2022-10-08 MED ORDER — CARVEDILOL 6.25 MG PO TABS
6.2500 mg | ORAL_TABLET | Freq: Two times a day (BID) | ORAL | Status: DC
  Administered 2022-10-08 – 2022-10-13 (×10): 6.25 mg via ORAL
  Filled 2022-10-08 (×8): qty 1
  Filled 2022-10-08: qty 2
  Filled 2022-10-08: qty 1

## 2022-10-08 MED ORDER — IOHEXOL 350 MG/ML SOLN
75.0000 mL | Freq: Once | INTRAVENOUS | Status: AC | PRN
  Administered 2022-10-08: 75 mL via INTRAVENOUS

## 2022-10-08 NOTE — H&P (Signed)
History and Physical  Samantha Bryant WUJ:[id number] DOB: February 13, 1955 DOA: 10/08/2022  PCP: Irven Coe, MD   Chief Complaint: Shortness of breath  HPI: Samantha Bryant is a 67 y.o. female with medical history significant for obesity, hypertension, hyperlipidemia being admitted to the hospital with 2 weeks of cough, dyspnea with exertion and concern for acute heart failure.  She had COVID a couple of weeks ago, has been feeling a little short of breath with exertion since that time, has had a nonproductive cough, denies any orthopnea, chest pain or shortness of breath at rest.  Does not think she has gained any weight, but thinks that she has developed some lower extremity edema in the last couple of weeks.  ED Course: Presented to the ER early this morning for evaluation, was noted to be saturating well on room air, but became hypoxic to 85% with ambulation.  Lab work including CBC and CMP were done, hemoglobin 10 previously 13, CMP unremarkable, troponin 119, 120, BNP 904.  CTA chest was performed which was significant only for evidence of acute bronchitis.  Review of Systems: Please see HPI for pertinent positives and negatives. A complete 10 system review of systems are otherwise negative.  Past Medical History:  Diagnosis Date   Hypertension    Past Surgical History:  Procedure Laterality Date   ABDOMINAL HYSTERECTOMY     CARPAL TUNNEL RELEASE     KNEE ARTHROSCOPY     TONSILLECTOMY      Social History:  reports that she has quit smoking. Her smoking use included cigarettes. She has never used smokeless tobacco. She reports that she does not drink alcohol and does not use drugs.   Allergies  Allergen Reactions   Hydrocodone     Severe Headache    Family History  Problem Relation Age of Onset   Diabetes Mother    Glaucoma Mother    Breast cancer Neg Hx      Prior to Admission medications   Medication Sig Start Date End Date Taking? Authorizing Provider  Guaifenesin 1200 MG  TB12 Take 1 tablet (1,200 mg total) by mouth 2 (two) times daily at 10 AM and 5 PM. Patient taking differently: Take 1 tablet by mouth 2 (two) times daily as needed. 09/16/22  Yes Linwood Dibbles, MD  ibuprofen (ADVIL) 200 MG tablet Take 200 mg by mouth every 6 (six) hours as needed for headache.   Yes [provider]  rosuvastatin (CRESTOR) 10 MG tablet Take 10 mg by mouth as directed. Take 1 tablet on MWF 10/17/19  Yes [provider]  benzonatate (TESSALON) 100 MG capsule Take 1 capsule (100 mg total) by mouth every 8 (eight) hours. Patient not taking: Reported on 10/08/2022 09/16/22   Linwood Dibbles, MD  oxyCODONE-acetaminophen (PERCOCET/ROXICET) 5-325 MG tablet Take 1 tablet by mouth 3 (three) times daily as needed for severe pain. Patient not taking: Reported on 10/08/2022 05/25/21   Gerhard Munch, MD    Physical Exam: BP 139/83   Pulse 81   Temp 98.3 F (36.8 C)   Resp (!) 24   Ht 5\' 5"  (1.651 m)   Wt 117.9 kg   SpO2 100%   BMI 43.27 kg/m   General:  Alert, oriented, calm, in no acute distress, obese female resting comfortably on room air.  No cough during my exam, looks comfortable. Eyes: EOMI, clear conjuctivae, white sclerea Neck: supple, no masses, trachea mildline  Cardiovascular: RRR, no murmurs or rubs, she has 2-3+ bilateral lower extremity  pitting edema  Respiratory: Difficult exam due to body habitus, but equal bilateral breath sounds with good air entry, no wheezing, rhonchi or crackles heard Abdomen: soft, nontender, nondistended, normal bowel tones heard  Skin: dry, no rashes  Musculoskeletal: no joint effusions, normal range of motion  Psychiatric: appropriate affect, normal speech  Neurologic: extraocular muscles intact, clear speech, moving all extremities with intact sensorium         Labs on Admission:  Basic Metabolic Panel: Recent Labs  Lab 10/08/22 0401  NA 143  K 3.7  CL 108  CO2 25  GLUCOSE 110*  BUN 8  CREATININE 0.70  CALCIUM 8.7*    Liver Function Tests: Recent Labs  Lab 10/08/22 0401  AST 20  ALT 18  ALKPHOS 57  BILITOT 0.8  PROT 6.6  ALBUMIN 3.9   No results for input(s): "LIPASE", "AMYLASE" in the last 168 hours. No results for input(s): "AMMONIA" in the last 168 hours. CBC: Recent Labs  Lab 10/08/22 0401  WBC 6.6  NEUTROABS 3.1  HGB 10.3*  HCT 32.1*  MCV 93.0  PLT 214   Cardiac Enzymes: No results for input(s): "CKTOTAL", "CKMB", "CKMBINDEX", "TROPONINI" in the last 168 hours.  BNP (last 3 results) Recent Labs    09/16/22 1803 10/08/22 0401  BNP 24.2 904.2*    ProBNP (last 3 results) No results for input(s): "PROBNP" in the last 8760 hours.  CBG: No results for input(s): "GLUCAP" in the last 168 hours.  Radiological Exams on Admission: CT Angio Chest PE W/Cm &/Or Wo Cm  Result Date: 10/08/2022 CLINICAL DATA:  67 year old female history of shortness of breath and unproductive cough. EXAM: CT ANGIOGRAPHY CHEST WITH CONTRAST TECHNIQUE: Multidetector CT imaging of the chest was performed using the standard protocol during bolus administration of intravenous contrast. Multiplanar CT image reconstructions and MIPs were obtained to evaluate the vascular anatomy. RADIATION DOSE REDUCTION: This exam was performed according to the departmental dose-optimization program which includes automated exposure control, adjustment of the mA and/or kV according to patient size and/or use of iterative reconstruction technique. CONTRAST:  75mL OMNIPAQUE IOHEXOL 350 MG/ML SOLN COMPARISON:  Chest CTA 11/24/2004. FINDINGS: Cardiovascular: There are no filling defects within the pulmonary arterial tree to suggest pulmonary embolism. Heart size is normal. There is no significant pericardial fluid, thickening or pericardial calcification. Aortic atherosclerosis. No definite coronary artery calcifications. Mediastinum/Nodes: No pathologically enlarged mediastinal or hilar lymph nodes. Esophagus is unremarkable in  appearance. No axillary lymphadenopathy. Lungs/Pleura: Diffuse bronchial wall thickening and mild thickening of the peribronchovascular interstitium. No acute consolidative airspace disease. No pleural effusions. No suspicious appearing pulmonary nodules or masses are noted. Upper Abdomen: 3 mm nonobstructive calculus in the upper pole collecting system of the right kidney. Musculoskeletal: There are no aggressive appearing lytic or blastic lesions noted in the visualized portions of the skeleton. Review of the MIP images confirms the above findings. IMPRESSION: 1. No evidence of pulmonary embolism. 2. Diffuse bronchial wall thickening and mild thickening of the peribronchovascular interstitium, concerning for probable acute bronchitis. 3. Aortic atherosclerosis. 4. 3 mm nonobstructive calculus in the upper pole collecting system of the right kidney incidentally noted. Aortic Atherosclerosis (ICD10-I70.0). Electronically Signed   By: Trudie Reed M.D.   On: 10/08/2022 05:38   DG Chest Port 1 View  Result Date: 10/08/2022 CLINICAL DATA:  Shortness of breath EXAM: PORTABLE CHEST 1 VIEW COMPARISON:  09/16/2022 FINDINGS: The heart size and mediastinal contours are within normal limits. Both lungs are clear. The visualized skeletal structures  are unremarkable. IMPRESSION: No active disease. Electronically Signed   By: Alcide Clever M.D.   On: 10/08/2022 03:34    Assessment/Plan Samantha Bryant is a 67 y.o. female with medical history significant for obesity, hypertension, hyperlipidemia being admitted to the hospital with 2 weeks of cough, dyspnea with exertion and concern for acute heart failure.   Suspected heart failure with reduced EF-given elevated BNP, peripheral edema, dyspnea with exertion. -Inpatient admission -Telemetry monitoring -Start low-dose Coreg -Check 2D echo -Will continue gentle diuresis -Monitor electrolytes and renal function daily -EDP has consulted  cardiology  Hyperlipidemia-continue Crestor, check fasting lipids  Anemia-unclear chronicity, has had some fluctuating anemia in the past, denies abdominal pain, melena or hematochezia. -Trend hemoglobin with daily labs -Check stool guaiac  Morbid obesity-BMI 43, complicating all aspects of care  DVT prophylaxis: Lovenox     Code Status: Full Code  Consults called: Cardiology  Admission status: The appropriate patient status for this patient is INPATIENT. Inpatient status is judged to be reasonable and necessary in order to provide the required intensity of service to ensure the patient's safety. The patient's presenting symptoms, physical exam findings, and initial radiographic and laboratory data in the context of their chronic comorbidities is felt to place them at high risk for further clinical deterioration. Furthermore, it is not anticipated that the patient will be medically stable for discharge from the hospital within 2 midnights of admission.    I certify that at the point of admission it is my clinical judgment that the patient will require inpatient hospital care spanning beyond 2 midnights from the point of admission due to high intensity of service, high risk for further deterioration and high frequency of surveillance required  Time spent: 58 minutes  Angeliz Settlemyre Sharlette Dense MD [facility] Pager [phone]  If 7PM-7AM, please contact night-coverage [email or web address] Password [facility]  10/08/2022, 7:17 AM

## 2022-10-08 NOTE — Progress Notes (Signed)
  Echocardiogram 2D Echocardiogram has been performed.  Samantha Bryant 10/08/2022, 2:36 PM

## 2022-10-08 NOTE — ED Notes (Signed)
Critical Troponin 118 given to Dr . Madilyn Hook

## 2022-10-08 NOTE — ED Provider Notes (Signed)
[place] EMERGENCY DEPARTMENT AT [facility] Provider Note   CSN: [id number] Arrival date & time: 10/08/22  0309     History  Chief Complaint  Patient presents with   Shortness of Breath    Samantha Bryant is a 67 y.o. female.  The history is provided by the patient and medical records.  Shortness of Breath Samantha Bryant is a 67 y.o. female who presents to the Emergency Department complaining of shortness of breath.  She presents to the emergency department for evaluation of shortness of breath and dyspnea on exertion that has been present for several weeks.  She reports occasional chest aching and has associated nonproductive cough.  No leg swelling or pain.  She does report associated liquid diarrhea for 2 weeks.  No hematochezia or melena.  No associated vomiting, nausea, abdominal pain, fever.  She has a history of hyperlipidemia, no additional symptoms.  No history of DVT/PE.  No history of cardiac disease.  No history of blood clots.      Home Medications Prior to Admission medications   Medication Sig Start Date End Date Taking? Authorizing Provider  Guaifenesin 1200 MG TB12 Take 1 tablet (1,200 mg total) by mouth 2 (two) times daily at 10 AM and 5 PM. Patient taking differently: Take 1 tablet by mouth 2 (two) times daily as needed. 09/16/22  Yes Linwood Dibbles, MD  ibuprofen (ADVIL) 200 MG tablet Take 200 mg by mouth every 6 (six) hours as needed for headache.   Yes [provider]  rosuvastatin (CRESTOR) 10 MG tablet Take 10 mg by mouth as directed. Take 1 tablet on MWF 10/17/19  Yes [provider]  benzonatate (TESSALON) 100 MG capsule Take 1 capsule (100 mg total) by mouth every 8 (eight) hours. Patient not taking: Reported on 10/08/2022 09/16/22   Linwood Dibbles, MD  oxyCODONE-acetaminophen (PERCOCET/ROXICET) 5-325 MG tablet Take 1 tablet by mouth 3 (three) times daily as needed for severe pain. Patient not taking: Reported on 10/08/2022 05/25/21   Gerhard Munch, MD      Allergies    Hydrocodone    Review of Systems   Review of Systems  Respiratory:  Positive for shortness of breath.   All other systems reviewed and are negative.   Physical Exam Updated Vital Signs BP 139/83   Pulse 81   Temp 98.3 F (36.8 C)   Resp (!) 24   Ht 5\' 5"  (1.651 m)   Wt 117.9 kg   SpO2 100%   BMI 43.27 kg/m  Physical Exam Vitals and nursing note reviewed.  Constitutional:      Appearance: She is well-developed.  HENT:     Head: Normocephalic and atraumatic.  Cardiovascular:     Rate and Rhythm: Normal rate and regular rhythm.     Heart sounds: No murmur heard. Pulmonary:     Effort: Pulmonary effort is normal. No respiratory distress.     Breath sounds: Normal breath sounds.  Abdominal:     Palpations: Abdomen is soft.     Tenderness: There is no abdominal tenderness. There is no guarding or rebound.  Musculoskeletal:        General: No tenderness.     Comments: 1+ pitting edema to BLE  Skin:    General: Skin is warm and dry.  Neurological:     Mental Status: She is alert and oriented to person, place, and time.  Psychiatric:        Behavior: Behavior normal.  ED Results / Procedures / Treatments   Labs (all labs ordered are listed, but only abnormal results are displayed) Labs Reviewed  COMPREHENSIVE METABOLIC PANEL - Abnormal; Notable for the following components:      Result Value   Glucose, Bld 110 (*)    Calcium 8.7 (*)    All other components within normal limits  BRAIN NATRIURETIC PEPTIDE - Abnormal; Notable for the following components:   B Natriuretic Peptide 904.2 (*)    All other components within normal limits  CBC WITH DIFFERENTIAL/PLATELET - Abnormal; Notable for the following components:   RBC 3.45 (*)    Hemoglobin 10.3 (*)    HCT 32.1 (*)    All other components within normal limits  TROPONIN I (HIGH SENSITIVITY) - Abnormal; Notable for the following components:   Troponin I (High Sensitivity) 118 (*)     All other components within normal limits  TROPONIN I (HIGH SENSITIVITY) - Abnormal; Notable for the following components:   Troponin I (High Sensitivity) 120 (*)    All other components within normal limits  RESP PANEL BY RT-PCR (RSV, FLU A&B, COVID)  [id number]  HIV ANTIBODY (ROUTINE TESTING W REFLEX)    EKG EKG Interpretation Date/Time:  Monday October 08 2022 03:19:00 EDT Ventricular Rate:  76 PR Interval:  183 QRS Duration:  101 QT Interval:  425 QTC Calculation: 478 R Axis:   147  Text Interpretation: Sinus rhythm Paired ventricular premature complexes Right axis deviation Confirmed by Tilden Fossa [phone]) on 10/08/2022 3:39:49 AM  Radiology CT Angio Chest PE W/Cm &/Or Wo Cm  Result Date: 10/08/2022 CLINICAL DATA:  67 year old female history of shortness of breath and unproductive cough. EXAM: CT ANGIOGRAPHY CHEST WITH CONTRAST TECHNIQUE: Multidetector CT imaging of the chest was performed using the standard protocol during bolus administration of intravenous contrast. Multiplanar CT image reconstructions and MIPs were obtained to evaluate the vascular anatomy. RADIATION DOSE REDUCTION: This exam was performed according to the departmental dose-optimization program which includes automated exposure control, adjustment of the mA and/or kV according to patient size and/or use of iterative reconstruction technique. CONTRAST:  75mL OMNIPAQUE IOHEXOL 350 MG/ML SOLN COMPARISON:  Chest CTA 11/24/2004. FINDINGS: Cardiovascular: There are no filling defects within the pulmonary arterial tree to suggest pulmonary embolism. Heart size is normal. There is no significant pericardial fluid, thickening or pericardial calcification. Aortic atherosclerosis. No definite coronary artery calcifications. Mediastinum/Nodes: No pathologically enlarged mediastinal or hilar lymph nodes. Esophagus is unremarkable in appearance. No axillary lymphadenopathy. Lungs/Pleura: Diffuse bronchial wall thickening and  mild thickening of the peribronchovascular interstitium. No acute consolidative airspace disease. No pleural effusions. No suspicious appearing pulmonary nodules or masses are noted. Upper Abdomen: 3 mm nonobstructive calculus in the upper pole collecting system of the right kidney. Musculoskeletal: There are no aggressive appearing lytic or blastic lesions noted in the visualized portions of the skeleton. Review of the MIP images confirms the above findings. IMPRESSION: 1. No evidence of pulmonary embolism. 2. Diffuse bronchial wall thickening and mild thickening of the peribronchovascular interstitium, concerning for probable acute bronchitis. 3. Aortic atherosclerosis. 4. 3 mm nonobstructive calculus in the upper pole collecting system of the right kidney incidentally noted. Aortic Atherosclerosis (ICD10-I70.0). Electronically Signed   By: Trudie Reed M.D.   On: 10/08/2022 05:38   DG Chest Port 1 View  Result Date: 10/08/2022 CLINICAL DATA:  Shortness of breath EXAM: PORTABLE CHEST 1 VIEW COMPARISON:  09/16/2022 FINDINGS: The heart size and mediastinal contours are within normal limits. Both lungs are  clear. The visualized skeletal structures are unremarkable. IMPRESSION: No active disease. Electronically Signed   By: Alcide Clever M.D.   On: 10/08/2022 03:34    Procedures Procedures    Medications Ordered in ED Medications  rosuvastatin (CRESTOR) tablet 10 mg (has no administration in time range)  benzonatate (TESSALON) capsule 100 mg (has no administration in time range)  enoxaparin (LOVENOX) injection 40 mg (has no administration in time range)  acetaminophen (TYLENOL) tablet 650 mg (has no administration in time range)    Or  acetaminophen (TYLENOL) suppository 650 mg (has no administration in time range)  traZODone (DESYREL) tablet 25 mg (has no administration in time range)  ondansetron (ZOFRAN) tablet 4 mg (has no administration in time range)    Or  ondansetron (ZOFRAN) injection  4 mg (has no administration in time range)  albuterol (PROVENTIL) (2.5 MG/3ML) 0.083% nebulizer solution 2.5 mg (has no administration in time range)  iohexol (OMNIPAQUE) 350 MG/ML injection 75 mL (75 mLs Intravenous Contrast Given 10/08/22 0520)  furosemide (LASIX) injection 20 mg (20 mg Intravenous Given 10/08/22 0604)    ED Course/ Medical Decision Making/ A&P                                 Medical Decision Making Amount and/or Complexity of Data Reviewed Labs: ordered. Radiology: ordered.  Risk Prescription drug management. Decision regarding hospitalization.   Patient here for evaluation of progressive dyspnea on exertion and cough.  She is nontoxic-appearing on evaluation with no respiratory distress.  She does have lower extremity edema.  EKG is without acute ischemic changes.  BNP is elevated when compared to priors, troponins are elevated but flat.  She does not have any active chest pain.  CTA PE study was obtained, which is negative for PE or pneumonia.  CT does demonstrate evidence of bronchitis.  She was treated with furosemide for diuresis.  Discussed with cardiology, okay to withhold heparin at this time, cardiology team will see the patient in consult.  Hospitalist consulted for admission.  Patient updated of findings of studies and recommendation for admission and she is in agreement treatment plan.        Final Clinical Impression(s) / ED Diagnoses Final diagnoses:  Acute congestive heart failure, unspecified heart failure type (HCC)  Elevated troponin    Rx / DC Orders ED Discharge Orders     None         Tilden Fossa, MD 10/08/22 [phone]

## 2022-10-08 NOTE — Consult Note (Addendum)
CONSULTATION NOTE   Patient Name: Samantha Bryant Date of Encounter: 10/08/2022 Cardiologist: None Electrophysiologist: None Advanced Heart Failure: None   Chief Complaint   Shortness of breath  Patient Profile   67 yo female with obesity, hypertension, dyslipidemia, presented with 2 week history of DOE, cough, following a COVID infections a few weeks ago. We are asked to see for congestive heart failure.  HPI   Samantha Bryant is a 67 y.o. female who is being seen today for the evaluation of CHF at the request of Dr. Kirby Crigler. This is a 67 year old female with a history of hypertension, dyslipidemia and obesity who presents with several weeks of progressive dyspnea on exertion, cough and fatigue following COVID-19 infection.  She notes primarily cough related to her COVID bronchitis but was not short of breath until after she tested negative and then her symptoms worsened.  On presentation she was noted to be hypoxemic with an SpO2 85% troponin mildly flat elevated 119 and 120 and elevated BNP of 904.  Chest x-ray was clear and CTA of the chest showed findings consistent with acute bronchitis but otherwise no significant findings -negative for PE or dissection however there is aortic atherosclerosis and no definite coronary calcifications.  She was ordered for Lasix 20 mg IV twice daily and is ready noted to be about 1.5 L negative.  Currently she reports some minimal improvement in breathing as well as lower extremity edema feeling that her legs are not quite as tight.  She has not gotten up to walk much.  She denies any family history of heart failure other notes a brother died of heart attack in his early 36s.  PMHx   Past Medical History:  Diagnosis Date   Hypertension     Past Surgical History:  Procedure Laterality Date   ABDOMINAL HYSTERECTOMY     CARPAL TUNNEL RELEASE     KNEE ARTHROSCOPY     TONSILLECTOMY      FAMHx   Family History  Problem Relation Age of Onset    Diabetes Mother    Glaucoma Mother    Breast cancer Neg Hx     SOCHx    reports that she has quit smoking. Her smoking use included cigarettes. She has never used smokeless tobacco. She reports that she does not drink alcohol and does not use drugs.  Outpatient Medications   No current facility-administered medications on file prior to encounter.   Current Outpatient Medications on File Prior to Encounter  Medication Sig Dispense Refill   Guaifenesin 1200 MG TB12 Take 1 tablet (1,200 mg total) by mouth 2 (two) times daily at 10 AM and 5 PM. (Patient taking differently: Take 1 tablet by mouth 2 (two) times daily as needed.) 14 tablet 0   ibuprofen (ADVIL) 200 MG tablet Take 200 mg by mouth every 6 (six) hours as needed for headache.     rosuvastatin (CRESTOR) 10 MG tablet Take 10 mg by mouth as directed. Take 1 tablet on MWF     benzonatate (TESSALON) 100 MG capsule Take 1 capsule (100 mg total) by mouth every 8 (eight) hours. (Patient not taking: Reported on 10/08/2022) 21 capsule 0   oxyCODONE-acetaminophen (PERCOCET/ROXICET) 5-325 MG tablet Take 1 tablet by mouth 3 (three) times daily as needed for severe pain. (Patient not taking: Reported on 10/08/2022) 10 tablet 0    Inpatient Medications    Scheduled Meds:  carvedilol  6.25 mg Oral BID WC   enoxaparin (LOVENOX) injection  60 mg Subcutaneous Q24H   furosemide  20 mg Intravenous BID   rosuvastatin  40 mg Oral QHS    Continuous Infusions:   PRN Meds: acetaminophen **OR** acetaminophen, albuterol, benzonatate, ondansetron **OR** ondansetron (ZOFRAN) IV, traZODone   ALLERGIES   Allergies  Allergen Reactions   Hydrocodone     Severe Headache    ROS   Pertinent items noted in HPI and remainder of comprehensive ROS otherwise negative.  Vitals   Vitals:   10/08/22 0500 10/08/22 0530 10/08/22 0610 10/08/22 0757  BP: (!) 136/90 (!) 138/90 139/83 (!) 116/90  Pulse: 82 79 81 85  Resp: 17 18 (!) 24   Temp:  98.1 F (36.7  C) 98.3 F (36.8 C)   TempSrc:  Oral    SpO2: 100% 100% 100%   Weight:      Height:        Intake/Output Summary (Last 24 hours) at 10/08/2022 1004 Last data filed at 10/08/2022 1610 Gross per 24 hour  Intake --  Output 1450 ml  Net -1450 ml   Filed Weights   10/08/22 0317  Weight: 117.9 kg    Physical Exam   General appearance: alert, no distress, and morbidly obese Neck: JVD - 3 cm above sternal notch and thyroid not enlarged, symmetric, no tenderness/mass/nodules Lungs: clear to auscultation bilaterally Heart: regular rate and rhythm Abdomen: soft, non-tender; bowel sounds normal; no masses,  no organomegaly and no guarding Extremities: edema trace bilateral pedal Pulses: 2+ and symmetric Skin: Skin color, texture, turgor normal. No rashes or lesions Neurologic: Grossly normal Psych: Pleasant  Labs   Results for orders placed or performed during the hospital encounter of 10/08/22 (from the past 48 hour(s))  Resp panel by RT-PCR (RSV, Flu A&B, Covid) Anterior Nasal Swab     Status: None   Collection Time: 10/08/22  3:21 AM   Specimen: Anterior Nasal Swab  Result Value Ref Range   SARS Coronavirus 2 by RT PCR NEGATIVE NEGATIVE    Comment: (NOTE) SARS-CoV-2 target nucleic acids are NOT DETECTED.  The SARS-CoV-2 RNA is generally detectable in upper respiratory specimens during the acute phase of infection. The lowest concentration of SARS-CoV-2 viral copies this assay can detect is 138 copies/mL. A negative result does not preclude SARS-Cov-2 infection and should not be used as the sole basis for treatment or other patient management decisions. A negative result may occur with  improper specimen collection/handling, submission of specimen other than nasopharyngeal swab, presence of viral mutation(s) within the areas targeted by this assay, and inadequate number of viral copies(<138 copies/mL). A negative result must be combined with clinical observations, patient  history, and epidemiological information. The expected result is Negative.  Fact Sheet for Patients:  BloggerCourse.com  Fact Sheet for Healthcare Providers:  SeriousBroker.it  This test is no t yet approved or cleared by the [place] FDA and  has been authorized for detection and/or diagnosis of SARS-CoV-2 by FDA under an Emergency Use Authorization (EUA). This EUA will remain  in effect (meaning this test can be used) for the duration of the COVID-19 declaration under Section 564(b)(1) of the Act, 21 U.S.C.section 360bbb-3(b)(1), unless the authorization is terminated  or revoked sooner.       Influenza A by PCR NEGATIVE NEGATIVE   Influenza B by PCR NEGATIVE NEGATIVE    Comment: (NOTE) The Xpert Xpress SARS-CoV-2/FLU/RSV plus assay is intended as an aid in the diagnosis of influenza from Nasopharyngeal swab specimens and should not be used as  a sole basis for treatment. Nasal washings and aspirates are unacceptable for Xpert Xpress SARS-CoV-2/FLU/RSV testing.  Fact Sheet for Patients: BloggerCourse.com  Fact Sheet for Healthcare Providers: SeriousBroker.it  This test is not yet approved or cleared by the [place] FDA and has been authorized for detection and/or diagnosis of SARS-CoV-2 by FDA under an Emergency Use Authorization (EUA). This EUA will remain in effect (meaning this test can be used) for the duration of the COVID-19 declaration under Section 564(b)(1) of the Act, 21 U.S.C. section 360bbb-3(b)(1), unless the authorization is terminated or revoked.     Resp Syncytial Virus by PCR NEGATIVE NEGATIVE    Comment: (NOTE) Fact Sheet for Patients: BloggerCourse.com  Fact Sheet for Healthcare Providers: SeriousBroker.it  This test is not yet approved or cleared by the [place] FDA and has been  authorized for detection and/or diagnosis of SARS-CoV-2 by FDA under an Emergency Use Authorization (EUA). This EUA will remain in effect (meaning this test can be used) for the duration of the COVID-19 declaration under Section 564(b)(1) of the Act, 21 U.S.C. section 360bbb-3(b)(1), unless the authorization is terminated or revoked.  Performed at [facility], [address], [place], [place] [address]   Comprehensive metabolic panel     Status: Abnormal   Collection Time: 10/08/22  4:01 AM  Result Value Ref Range   Sodium 143 135 - 145 mmol/L   Potassium 3.7 3.5 - 5.1 mmol/L   Chloride 108 98 - 111 mmol/L   CO2 25 22 - 32 mmol/L   Glucose, Bld 110 (H) 70 - 99 mg/dL    Comment: Glucose reference range applies only to samples taken after fasting for at least 8 hours.   BUN 8 8 - 23 mg/dL   Creatinine, Ser 6.04 0.44 - 1.00 mg/dL   Calcium 8.7 (L) 8.9 - 10.3 mg/dL   Total Protein 6.6 6.5 - 8.1 g/dL   Albumin 3.9 3.5 - 5.0 g/dL   AST 20 15 - 41 U/L   ALT 18 0 - 44 U/L   Alkaline Phosphatase 57 38 - 126 U/L   Total Bilirubin 0.8 0.3 - 1.2 mg/dL   GFR, Estimated >54 >09 mL/min    Comment: (NOTE) Calculated using the CKD-EPI Creatinine Equation (2021)    Anion gap 10 5 - 15    Comment: Performed at [facility], [address] [place], [place] [address]  Brain natriuretic peptide     Status: Abnormal   Collection Time: 10/08/22  4:01 AM  Result Value Ref Range   B Natriuretic Peptide 904.2 (H) 0.0 - 100.0 pg/mL    Comment: Performed at [facility], [address] [place], [place] [address]  Troponin I (High Sensitivity)     Status: Abnormal   Collection Time: 10/08/22  4:01 AM  Result Value Ref Range   Troponin I (High Sensitivity) 118 (HH) <18 ng/L    Comment: CRITICAL RESULT CALLED TO, READ BACK BY AND VERIFIED WITH AVREU D. 0451 10/08/22 MCLEAN K. (NOTE) Elevated high sensitivity troponin I (hsTnI) values and  significant  changes across serial measurements may suggest ACS but many other  chronic and acute conditions are known to elevate hsTnI results.  Refer to the "Links" section for chest pain algorithms and additional  guidance. Performed at [facility], [address], [place], [place] [address]   CBC with Differential     Status: Abnormal   Collection Time: 10/08/22  4:01 AM  Result Value Ref Range   WBC 6.6 4.0 - 10.5 K/uL   RBC 3.45 (L) 3.87 - 5.11 MIL/uL   Hemoglobin 10.3 (L) 12.0 - 15.0 g/dL   HCT 40.9 (L) 81.1 - 91.4 %   MCV 93.0 80.0 - 100.0 fL   MCH 29.9 26.0 - 34.0 pg   MCHC 32.1 30.0 - 36.0 g/dL   RDW 78.2 95.6 - 21.3 %   Platelets 214 150 - 400 K/uL   nRBC 0.0 0.0 - 0.2 %   Neutrophils Relative % 48 %   Neutro Abs 3.1 1.7 - 7.7 K/uL   Lymphocytes Relative 42 %   Lymphs Abs 2.8 0.7 - 4.0 K/uL   Monocytes Relative 7 %   Monocytes Absolute 0.5 0.1 - 1.0 K/uL   Eosinophils Relative 3 %   Eosinophils Absolute 0.2 0.0 - 0.5 K/uL   Basophils Relative 0 %   Basophils Absolute 0.0 0.0 - 0.1 K/uL   Immature Granulocytes 0 %   Abs Immature Granulocytes 0.02 0.00 - 0.07 K/uL    Comment: Performed at [facility], [address], [place], [place] [address]  Troponin I (High Sensitivity)     Status: Abnormal   Collection Time: 10/08/22  5:04 AM  Result Value Ref Range   Troponin I (High Sensitivity) 120 (HH) <18 ng/L    Comment: CRITICAL RESULT CALLED TO, READ BACK BY AND VERIFIED WITH ABREU, D. RN AT [phone] ON 10/08/2022 BY MECIAL J. (NOTE) Elevated high sensitivity troponin I (hsTnI) values and significant  changes across serial measurements may suggest ACS but many other  chronic and acute conditions are known to elevate hsTnI results.  Refer to the "Links" section for chest pain algorithms and additional  guidance. Performed at [facility], [address], [place], [place] [address]   Lipid panel     Status:  Abnormal   Collection Time: 10/08/22  5:04 AM  Result Value Ref Range   Cholesterol 255 (H) 0 - 200 mg/dL   Triglycerides 87 <841 mg/dL   HDL 55 >32 mg/dL   Total CHOL/HDL Ratio 4.6 RATIO   VLDL 17 0 - 40 mg/dL   LDL Cholesterol 440 (H) 0 - 99 mg/dL    Comment:        Total Cholesterol/HDL:CHD Risk Coronary Heart Disease Risk Table                     Men   Women  1/2 Average Risk   3.4   3.3  Average Risk       5.0   4.4  2 X Average Risk   9.6   7.1  3 X Average Risk  23.4   11.0        Use the calculated Patient Ratio above and the CHD Risk Table to determine the patient's CHD Risk.        ATP III CLASSIFICATION (LDL):  <100     mg/dL   Optimal  102-725  mg/dL   Near or Above                    Optimal  130-159  mg/dL   Borderline  366-440  mg/dL   High  >347     mg/dL   Very High Performed at [facility], [address], [place], [place] [address]     ECG   Sinus rhythm with PVCs at 76- Personally Reviewed  Telemetry  Sinus rhythm- Personally Reviewed  Radiology   CT Angio Chest PE W/Cm &/Or Wo Cm  Result Date: 10/08/2022 CLINICAL DATA:  67 year old female history of shortness of breath and unproductive cough. EXAM: CT ANGIOGRAPHY CHEST WITH CONTRAST TECHNIQUE: Multidetector CT imaging of the chest was performed using the standard protocol during bolus administration of intravenous contrast. Multiplanar CT image reconstructions and MIPs were obtained to evaluate the vascular anatomy. RADIATION DOSE REDUCTION: This exam was performed according to the departmental dose-optimization program which includes automated exposure control, adjustment of the mA and/or kV according to patient size and/or use of iterative reconstruction technique. CONTRAST:  75mL OMNIPAQUE IOHEXOL 350 MG/ML SOLN COMPARISON:  Chest CTA 11/24/2004. FINDINGS: Cardiovascular: There are no filling defects within the pulmonary arterial tree to suggest pulmonary embolism. Heart size  is normal. There is no significant pericardial fluid, thickening or pericardial calcification. Aortic atherosclerosis. No definite coronary artery calcifications. Mediastinum/Nodes: No pathologically enlarged mediastinal or hilar lymph nodes. Esophagus is unremarkable in appearance. No axillary lymphadenopathy. Lungs/Pleura: Diffuse bronchial wall thickening and mild thickening of the peribronchovascular interstitium. No acute consolidative airspace disease. No pleural effusions. No suspicious appearing pulmonary nodules or masses are noted. Upper Abdomen: 3 mm nonobstructive calculus in the upper pole collecting system of the right kidney. Musculoskeletal: There are no aggressive appearing lytic or blastic lesions noted in the visualized portions of the skeleton. Review of the MIP images confirms the above findings. IMPRESSION: 1. No evidence of pulmonary embolism. 2. Diffuse bronchial wall thickening and mild thickening of the peribronchovascular interstitium, concerning for probable acute bronchitis. 3. Aortic atherosclerosis. 4. 3 mm nonobstructive calculus in the upper pole collecting system of the right kidney incidentally noted. Aortic Atherosclerosis (ICD10-I70.0). Electronically Signed   By: Trudie Reed M.D.   On: 10/08/2022 05:38   DG Chest Port 1 View  Result Date: 10/08/2022 CLINICAL DATA:  Shortness of breath EXAM: PORTABLE CHEST 1 VIEW COMPARISON:  09/16/2022 FINDINGS: The heart size and mediastinal contours are within normal limits. Both lungs are clear. The visualized skeletal structures are unremarkable. IMPRESSION: No active disease. Electronically Signed   By: Alcide Clever M.D.   On: 10/08/2022 03:34    Cardiac Studies   Echo pending  Impression   Principal Problem:   Acute CHF (congestive heart failure) (HCC) Active Problems:   Dyspnea on exertion   Dyslipidemia, goal LDL below 70   HTN (hypertension)   Recommendation   Acute congestive heart failure Presentation of  acute dyspnea on exertion following COVID positive bronchitis.  She subsequently has tested negative for presented and was hypoxemic with an elevated BNP consistent with congestive heart failure.  She has already diuresed about 1.5 L negative.  Will continue current IV Lasix dosing.  Echo pending.  Will titrate GDMT based on these findings.  Possible viral cardiomyopathy but more likely nonischemic.  Flat mildly elevated troponin likely related to heart failure/demand ischemia. Dyslipidemia TC 255, TG 87, HDL 55 and LDL 183 -noted to have aortic atherosclerosis on imaging.  Home medication is rosuvastatin 10 mg 3 times weekly.  If tolerated would increase to 40 mg daily.  Check LP(a), suspect elevated. Hypertension Blood pressure reasonably well-controlled to mildly elevated.  Will adjust medications as per echo findings.  Thanks for the consultation. Cardiology will follow with you.  Time Spent Directly with Patient:  I have spent a total of 45 minutes with the patient reviewing hospital notes, telemetry, EKGs, labs and examining the patient as well as establishing an assessment and plan  that was discussed personally with the patient.  > 50% of time was spent in direct patient care.  Length of Stay:  LOS: 0 days   Chrystie Nose, MD, [facility], FACP    [facility]  Medical Director of the [facility] Lipid Disorders &  Cardiovascular Risk Reduction Clinic Diplomate of the American Board of Clinical Lipidology Attending Cardiologist  Direct Dial: [phone]  Fax: [phone]  Website:  [email or web address] Nicely Sherice Ijames 10/08/2022, 10:04 AM

## 2022-10-08 NOTE — ED Triage Notes (Signed)
Pt arrived POV for on-going SOB for 2 weeks, was seen 8/25 for same, reports "the doctor said my lungs and heart was good, but I am still SOB". Pt reports unproductive cough and headache, denies nasal symptoms. Pt reports "it feels like it stuck in my upper chest, like I can't cough it up". Has tried OTC Mucinix w/o relief. Pt able to speak in complete sentences, sats 100% RA.

## 2022-10-08 NOTE — Plan of Care (Signed)
Problem: Education: Goal: Knowledge of General Education information will improve Description Including pain rating scale, medication(s)/side effects and non-pharmacologic comfort measures Outcome: Progressing   Problem: Health Behavior/Discharge Planning: Goal: Ability to manage health-related needs will improve Outcome: Progressing   Problem: Clinical Measurements: Goal: Ability to maintain clinical measurements within normal limits will improve Outcome: Not Progressing

## 2022-10-08 NOTE — ED Notes (Signed)
ED TO INPATIENT HANDOFF REPORT  Name/Age/Gender Samantha Bryant 67 y.o. female  Code Status    Code Status Orders  (From admission, onward)           Start     Ordered   10/08/22 0714  Full code  Continuous       Question:  By:  Answer:  Consent: discussion documented in EHR   10/08/22 0714           Code Status History     This patient has a current code status but no historical code status.       Home/SNF/Other Home  Chief Complaint Dyspnea on exertion [R06.09] Acute CHF (congestive heart failure) (HCC) [I50.9]  Level of Care/Admitting Diagnosis ED Disposition     ED Disposition  Admit   Condition  --   Comment  Hospital Area: [facility] [[id number]]  Level of Care: Telemetry [5]  Admit to tele based on following criteria: Acute CHF  May admit patient to [facility] or [facility] if equivalent level of care is available:: Yes  Covid Evaluation: Asymptomatic - no recent exposure (last 10 days) testing not required  Diagnosis: Acute CHF (congestive heart failure) [facility]) [[id number]]  Admitting Physician: Maryln Gottron [[id number]]  Attending Physician: Olexa.Dam, MIR Jaxson.Roy [[id number]]  Certification:: I certify this patient will need inpatient services for at least 2 midnights  Expected Medical Readiness: 10/10/2022          Medical History Past Medical History:  Diagnosis Date   Hypertension     Allergies Allergies  Allergen Reactions   Hydrocodone     Severe Headache    IV Location/Drains/Wounds Patient Lines/Drains/Airways Status     Active Line/Drains/Airways     Name Placement date Placement time Site Days   Peripheral IV 10/08/22 20 G 1" Right Antecubital 10/08/22  0404  Antecubital  less than 1            Labs/Imaging Results for orders placed or performed during the hospital encounter of 10/08/22 (from the past 48 hour(s))  Resp panel by RT-PCR (RSV, Flu A&B, Covid) Anterior Nasal Swab     Status: None    Collection Time: 10/08/22  3:21 AM   Specimen: Anterior Nasal Swab  Result Value Ref Range   SARS Coronavirus 2 by RT PCR NEGATIVE NEGATIVE    Comment: (NOTE) SARS-CoV-2 target nucleic acids are NOT DETECTED.  The SARS-CoV-2 RNA is generally detectable in upper respiratory specimens during the acute phase of infection. The lowest concentration of SARS-CoV-2 viral copies this assay can detect is 138 copies/mL. A negative result does not preclude SARS-Cov-2 infection and should not be used as the sole basis for treatment or other patient management decisions. A negative result may occur with  improper specimen collection/handling, submission of specimen other than nasopharyngeal swab, presence of viral mutation(s) within the areas targeted by this assay, and inadequate number of viral copies(<138 copies/mL). A negative result must be combined with clinical observations, patient history, and epidemiological information. The expected result is Negative.  Fact Sheet for Patients:  BloggerCourse.com  Fact Sheet for Healthcare Providers:  SeriousBroker.it  This test is no t yet approved or cleared by the [place] FDA and  has been authorized for detection and/or diagnosis of SARS-CoV-2 by FDA under an Emergency Use Authorization (EUA). This EUA will remain  in effect (meaning this test can be used) for the duration of the COVID-19 declaration under Section 564(b)(1) of the Act,  21 U.S.C.section 360bbb-3(b)(1), unless the authorization is terminated  or revoked sooner.       Influenza A by PCR NEGATIVE NEGATIVE   Influenza B by PCR NEGATIVE NEGATIVE    Comment: (NOTE) The Xpert Xpress SARS-CoV-2/FLU/RSV plus assay is intended as an aid in the diagnosis of influenza from Nasopharyngeal swab specimens and should not be used as a sole basis for treatment. Nasal washings and aspirates are unacceptable for Xpert Xpress  SARS-CoV-2/FLU/RSV testing.  Fact Sheet for Patients: BloggerCourse.com  Fact Sheet for Healthcare Providers: SeriousBroker.it  This test is not yet approved or cleared by the [place] FDA and has been authorized for detection and/or diagnosis of SARS-CoV-2 by FDA under an Emergency Use Authorization (EUA). This EUA will remain in effect (meaning this test can be used) for the duration of the COVID-19 declaration under Section 564(b)(1) of the Act, 21 U.S.C. section 360bbb-3(b)(1), unless the authorization is terminated or revoked.     Resp Syncytial Virus by PCR NEGATIVE NEGATIVE    Comment: (NOTE) Fact Sheet for Patients: BloggerCourse.com  Fact Sheet for Healthcare Providers: SeriousBroker.it  This test is not yet approved or cleared by the [place] FDA and has been authorized for detection and/or diagnosis of SARS-CoV-2 by FDA under an Emergency Use Authorization (EUA). This EUA will remain in effect (meaning this test can be used) for the duration of the COVID-19 declaration under Section 564(b)(1) of the Act, 21 U.S.C. section 360bbb-3(b)(1), unless the authorization is terminated or revoked.  Performed at [facility], [address], [place], [place] [address]   Comprehensive metabolic panel     Status: Abnormal   Collection Time: 10/08/22  4:01 AM  Result Value Ref Range   Sodium 143 135 - 145 mmol/L   Potassium 3.7 3.5 - 5.1 mmol/L   Chloride 108 98 - 111 mmol/L   CO2 25 22 - 32 mmol/L   Glucose, Bld 110 (H) 70 - 99 mg/dL    Comment: Glucose reference range applies only to samples taken after fasting for at least 8 hours.   BUN 8 8 - 23 mg/dL   Creatinine, Ser 6.04 0.44 - 1.00 mg/dL   Calcium 8.7 (L) 8.9 - 10.3 mg/dL   Total Protein 6.6 6.5 - 8.1 g/dL   Albumin 3.9 3.5 - 5.0 g/dL   AST 20 15 - 41 U/L   ALT 18 0 - 44 U/L    Alkaline Phosphatase 57 38 - 126 U/L   Total Bilirubin 0.8 0.3 - 1.2 mg/dL   GFR, Estimated >54 >09 mL/min    Comment: (NOTE) Calculated using the CKD-EPI Creatinine Equation (2021)    Anion gap 10 5 - 15    Comment: Performed at [facility], [address], [place], [place] [address]  Brain natriuretic peptide     Status: Abnormal   Collection Time: 10/08/22  4:01 AM  Result Value Ref Range   B Natriuretic Peptide 904.2 (H) 0.0 - 100.0 pg/mL    Comment: Performed at [facility], [address] [place], [place] [address]  Troponin I (High Sensitivity)     Status: Abnormal   Collection Time: 10/08/22  4:01 AM  Result Value Ref Range   Troponin I (High Sensitivity) 118 (HH) <18 ng/L    Comment: CRITICAL RESULT CALLED TO, READ BACK BY AND VERIFIED WITH AVREU D. 0451 10/08/22 MCLEAN K. (NOTE) Elevated high sensitivity troponin I (hsTnI) values and significant  changes across serial measurements may  suggest ACS but many other  chronic and acute conditions are known to elevate hsTnI results.  Refer to the "Links" section for chest pain algorithms and additional  guidance. Performed at [facility], [address], [place], [place] [address]   CBC with Differential     Status: Abnormal   Collection Time: 10/08/22  4:01 AM  Result Value Ref Range   WBC 6.6 4.0 - 10.5 K/uL   RBC 3.45 (L) 3.87 - 5.11 MIL/uL   Hemoglobin 10.3 (L) 12.0 - 15.0 g/dL   HCT 95.2 (L) 84.1 - 32.4 %   MCV 93.0 80.0 - 100.0 fL   MCH 29.9 26.0 - 34.0 pg   MCHC 32.1 30.0 - 36.0 g/dL   RDW 40.1 02.7 - 25.3 %   Platelets 214 150 - 400 K/uL   nRBC 0.0 0.0 - 0.2 %   Neutrophils Relative % 48 %   Neutro Abs 3.1 1.7 - 7.7 K/uL   Lymphocytes Relative 42 %   Lymphs Abs 2.8 0.7 - 4.0 K/uL   Monocytes Relative 7 %   Monocytes Absolute 0.5 0.1 - 1.0 K/uL   Eosinophils Relative 3 %   Eosinophils Absolute 0.2 0.0 - 0.5 K/uL   Basophils Relative 0 %    Basophils Absolute 0.0 0.0 - 0.1 K/uL   Immature Granulocytes 0 %   Abs Immature Granulocytes 0.02 0.00 - 0.07 K/uL    Comment: Performed at [facility], [address], [place], [place] [address]  Troponin I (High Sensitivity)     Status: Abnormal   Collection Time: 10/08/22  5:04 AM  Result Value Ref Range   Troponin I (High Sensitivity) 120 (HH) <18 ng/L    Comment: CRITICAL RESULT CALLED TO, READ BACK BY AND VERIFIED WITH ABREU, D. RN AT [phone] ON 10/08/2022 BY MECIAL J. (NOTE) Elevated high sensitivity troponin I (hsTnI) values and significant  changes across serial measurements may suggest ACS but many other  chronic and acute conditions are known to elevate hsTnI results.  Refer to the "Links" section for chest pain algorithms and additional  guidance. Performed at [facility], [address], [place], [place] [address]   Lipid panel     Status: Abnormal   Collection Time: 10/08/22  5:04 AM  Result Value Ref Range   Cholesterol 255 (H) 0 - 200 mg/dL   Triglycerides 87 <387 mg/dL   HDL 55 >56 mg/dL   Total CHOL/HDL Ratio 4.6 RATIO   VLDL 17 0 - 40 mg/dL   LDL Cholesterol 433 (H) 0 - 99 mg/dL    Comment:        Total Cholesterol/HDL:CHD Risk Coronary Heart Disease Risk Table                     Men   Women  1/2 Average Risk   3.4   3.3  Average Risk       5.0   4.4  2 X Average Risk   9.6   7.1  3 X Average Risk  23.4   11.0        Use the calculated Patient Ratio above and the CHD Risk Table to determine the patient's CHD Risk.        ATP III CLASSIFICATION (LDL):  <100     mg/dL   Optimal  295-188  mg/dL   Near or Above  Optimal  130-159  mg/dL   Borderline  161-096  mg/dL   High  >045     mg/dL   Very High Performed at [facility], [address] [place], [place] [address]   HIV Antibody (routine testing w rflx)     Status: None   Collection Time: 10/08/22  7:45 AM  Result Value  Ref Range   HIV Screen 4th Generation wRfx Non Reactive Non Reactive    Comment: Performed at [facility], [address] [place], [place] [address]   ECHOCARDIOGRAM COMPLETE  Result Date: 10/08/2022    ECHOCARDIOGRAM REPORT   Patient Name:   SHAKENYA CLAUSING Date of Exam: 10/08/2022 Medical Rec #:  [id number]    Height:       65.0 in Accession #:    [id number]   Weight:       260.0 lb Date of Birth:  [birth year]    BSA:          2.211 m Patient Age:    37 years     BP:           110/82 mmHg Patient Gender: F            HR:           78 bpm. Exam Location:  Inpatient Procedure: 2D Echo, Color Doppler and Cardiac Doppler                             MODIFIED REPORT: This report was modified by Olga Millers MD on 10/08/2022 due to Change.  Indications:     I50.40* Unspecified combined systolic (congestive) and                  diastolic (congestive) heart failure  History:         Patient has no prior history of Echocardiogram examinations.                  CHF, Signs/Symptoms:Dyspnea and Shortness of Breath; Risk                  Factors:Dyslipidemia and Hypertension.  Sonographer:     Sheralyn Boatman RDCS Referring Phys:  [id number] MIR Judie Petit [facility] Diagnosing Phys: Olga Millers MD  Sonographer Comments: Patient is obese. IMPRESSIONS  1. Severe hypokinesis of the entire apex with overall severe LV dysfunction; pattern suggestive of stress induced cardiomyopathy.  2. Left ventricular ejection fraction, by estimation, is 25 to 30%. The left ventricle has severely decreased function. The left ventricle demonstrates regional wall motion abnormalities (see scoring diagram/findings for description). The left ventricular internal cavity size was moderately dilated. Left ventricular diastolic parameters are consistent with Grade II diastolic dysfunction (pseudonormalization).  3. Right ventricular systolic function is normal. The right ventricular size is normal.  4. Left atrial size was mildly dilated.  5. A small  pericardial effusion is present. The pericardial effusion is circumferential. There is no evidence of cardiac tamponade.  6. The mitral valve is grossly normal. Mild mitral valve regurgitation. No evidence of mitral stenosis.  7. The aortic valve is tricuspid. Aortic valve regurgitation is not visualized. No aortic stenosis is present.  8. The inferior vena cava is dilated in size with <50% respiratory variability, suggesting right atrial pressure of 15 mmHg. Comparison(s): No prior Echocardiogram. FINDINGS  Left Ventricle: Left ventricular ejection fraction, by estimation, is 25 to 30%. The left ventricle has severely decreased function.  The left ventricle demonstrates regional wall motion abnormalities. The left ventricular internal cavity size was moderately dilated. There is no left ventricular hypertrophy. Left ventricular diastolic parameters are consistent with Grade II diastolic dysfunction (pseudonormalization). Right Ventricle: The right ventricular size is normal. Right ventricular systolic function is normal. Left Atrium: Left atrial size was mildly dilated. Right Atrium: Right atrial size was normal in size. Pericardium: A small pericardial effusion is present. The pericardial effusion is circumferential. There is no evidence of cardiac tamponade. Mitral Valve: The mitral valve is grossly normal. Mild mitral valve regurgitation. No evidence of mitral valve stenosis. Tricuspid Valve: The tricuspid valve is normal in structure. Tricuspid valve regurgitation is trivial. No evidence of tricuspid stenosis. Aortic Valve: The aortic valve is tricuspid. Aortic valve regurgitation is not visualized. No aortic stenosis is present. Pulmonic Valve: The pulmonic valve was normal in structure. Pulmonic valve regurgitation is not visualized. No evidence of pulmonic stenosis. Aorta: The aortic root and ascending aorta are structurally normal, with no evidence of dilitation. Venous: The inferior vena cava is dilated in  size with less than 50% respiratory variability, suggesting right atrial pressure of 15 mmHg. IAS/Shunts: No atrial level shunt detected by color flow Doppler. Additional Comments: Severe hypokinesis of the entire apex with overall severe LV dysfunction; pattern suggestive of stress induced cardiomyopathy.  LEFT VENTRICLE PLAX 2D LVIDd:         5.20 cm      Diastology LVIDs:         4.00 cm      LV e' medial:    7.18 cm/s LV PW:         1.00 cm      LV E/e' medial:  13.1 LV IVS:        0.90 cm      LV e' lateral:   9.46 cm/s LVOT diam:     2.00 cm      LV E/e' lateral: 9.9 LV SV:         66 LV SV Index:   30 LVOT Area:     3.14 cm  LV Volumes (MOD) LV vol d, MOD A2C: 114.0 ml LV vol d, MOD A4C: 105.0 ml LV vol s, MOD A2C: 80.0 ml LV vol s, MOD A4C: 63.4 ml LV SV MOD A2C:     34.0 ml LV SV MOD A4C:     105.0 ml LV SV MOD BP:      41.9 ml RIGHT VENTRICLE            IVC RV S prime:     9.90 cm/s  IVC diam: 2.50 cm TAPSE (M-mode): 1.4 cm LEFT ATRIUM             Index        RIGHT ATRIUM           Index LA diam:        3.80 cm 1.72 cm/m   RA Area:     11.60 cm LA Vol (A2C):   74.6 ml 33.73 ml/m  RA Volume:   26.90 ml  12.16 ml/m LA Vol (A4C):   38.3 ml 17.32 ml/m LA Biplane Vol: 56.1 ml 25.37 ml/m  AORTIC VALVE LVOT Vmax:   102.00 cm/s LVOT Vmean:  64.900 cm/s LVOT VTI:    0.209 m  AORTA Ao Root diam: 3.60 cm Ao Asc diam:  3.20 cm MITRAL VALVE MV Area (PHT): 3.72 cm    SHUNTS MV Decel Time: 204 msec  Systemic VTI:  0.21 m MV E velocity: 94.00 cm/s  Systemic Diam: 2.00 cm MV A velocity: 45.30 cm/s MV E/A ratio:  2.08 Olga Millers MD Electronically signed by Olga Millers MD Signature Date/Time: 10/08/2022/2:50:25 PM    Final (Updated)    CT Angio Chest PE W/Cm &/Or Wo Cm  Result Date: 10/08/2022 CLINICAL DATA:  67 year old female history of shortness of breath and unproductive cough. EXAM: CT ANGIOGRAPHY CHEST WITH CONTRAST TECHNIQUE: Multidetector CT imaging of the chest was performed using the standard  protocol during bolus administration of intravenous contrast. Multiplanar CT image reconstructions and MIPs were obtained to evaluate the vascular anatomy. RADIATION DOSE REDUCTION: This exam was performed according to the departmental dose-optimization program which includes automated exposure control, adjustment of the mA and/or kV according to patient size and/or use of iterative reconstruction technique. CONTRAST:  75mL OMNIPAQUE IOHEXOL 350 MG/ML SOLN COMPARISON:  Chest CTA 11/24/2004. FINDINGS: Cardiovascular: There are no filling defects within the pulmonary arterial tree to suggest pulmonary embolism. Heart size is normal. There is no significant pericardial fluid, thickening or pericardial calcification. Aortic atherosclerosis. No definite coronary artery calcifications. Mediastinum/Nodes: No pathologically enlarged mediastinal or hilar lymph nodes. Esophagus is unremarkable in appearance. No axillary lymphadenopathy. Lungs/Pleura: Diffuse bronchial wall thickening and mild thickening of the peribronchovascular interstitium. No acute consolidative airspace disease. No pleural effusions. No suspicious appearing pulmonary nodules or masses are noted. Upper Abdomen: 3 mm nonobstructive calculus in the upper pole collecting system of the right kidney. Musculoskeletal: There are no aggressive appearing lytic or blastic lesions noted in the visualized portions of the skeleton. Review of the MIP images confirms the above findings. IMPRESSION: 1. No evidence of pulmonary embolism. 2. Diffuse bronchial wall thickening and mild thickening of the peribronchovascular interstitium, concerning for probable acute bronchitis. 3. Aortic atherosclerosis. 4. 3 mm nonobstructive calculus in the upper pole collecting system of the right kidney incidentally noted. Aortic Atherosclerosis (ICD10-I70.0). Electronically Signed   By: Trudie Reed M.D.   On: 10/08/2022 05:38   DG Chest Port 1 View  Result Date:  10/08/2022 CLINICAL DATA:  Shortness of breath EXAM: PORTABLE CHEST 1 VIEW COMPARISON:  09/16/2022 FINDINGS: The heart size and mediastinal contours are within normal limits. Both lungs are clear. The visualized skeletal structures are unremarkable. IMPRESSION: No active disease. Electronically Signed   By: Alcide Clever M.D.   On: 10/08/2022 03:34    Pending Labs Unresulted Labs (From admission, onward)     Start     Ordered   10/09/22 0500  Basic metabolic panel  Tomorrow morning,   R        10/08/22 0714   10/09/22 0500  CBC  Tomorrow morning,   R        10/08/22 0714   10/09/22 0500  Occult blood card to lab, stool  Daily,   R      10/08/22 0720   10/09/22 0500  Lipoprotein A (LPA)  Tomorrow morning,   R        10/08/22 1003            Vitals/Pain Today's Vitals   10/08/22 1145 10/08/22 1149 10/08/22 1151 10/08/22 1502  BP: 110/82 110/82 110/82 117/78  Pulse:  60 60 62  Resp: (!) 21  20 (!) 22  Temp:  98.2 F (36.8 C) 98.2 F (36.8 C)   TempSrc:  Oral Oral   SpO2:  100% 100% 98%  Weight:      Height:  PainSc:        Isolation Precautions No active isolations  Medications Medications  benzonatate (TESSALON) capsule 100 mg (has no administration in time range)  enoxaparin (LOVENOX) injection 60 mg (60 mg Subcutaneous Given 10/08/22 0859)  acetaminophen (TYLENOL) tablet 650 mg (has no administration in time range)    Or  acetaminophen (TYLENOL) suppository 650 mg (has no administration in time range)  traZODone (DESYREL) tablet 25 mg (has no administration in time range)  ondansetron (ZOFRAN) tablet 4 mg (has no administration in time range)    Or  ondansetron (ZOFRAN) injection 4 mg (has no administration in time range)  albuterol (PROVENTIL) (2.5 MG/3ML) 0.083% nebulizer solution 2.5 mg (has no administration in time range)  carvedilol (COREG) tablet 6.25 mg (6.25 mg Oral Given 10/08/22 0757)  furosemide (LASIX) injection 20 mg (has no administration in time  range)  rosuvastatin (CRESTOR) tablet 40 mg (has no administration in time range)  iohexol (OMNIPAQUE) 350 MG/ML injection 75 mL (75 mLs Intravenous Contrast Given 10/08/22 0520)  furosemide (LASIX) injection 20 mg (20 mg Intravenous Given 10/08/22 0604)    Mobility walks with person assist

## 2022-10-09 ENCOUNTER — Encounter (HOSPITAL_COMMUNITY): Payer: Self-pay | Admitting: Internal Medicine

## 2022-10-09 DIAGNOSIS — R7989 Other specified abnormal findings of blood chemistry: Secondary | ICD-10-CM

## 2022-10-09 DIAGNOSIS — I5021 Acute systolic (congestive) heart failure: Secondary | ICD-10-CM

## 2022-10-09 LAB — BASIC METABOLIC PANEL
Anion gap: 9 (ref 5–15)
BUN: 6 mg/dL — ABNORMAL LOW (ref 8–23)
CO2: 27 mmol/L (ref 22–32)
Calcium: 8.4 mg/dL — ABNORMAL LOW (ref 8.9–10.3)
Chloride: 104 mmol/L (ref 98–111)
Creatinine, Ser: 0.84 mg/dL (ref 0.44–1.00)
GFR, Estimated: >60 mL/min (ref >60)
Glucose, Bld: 99 mg/dL (ref 70–99)
Potassium: 2.7 mmol/L — CL (ref 3.5–5.1)
Sodium: 140 mmol/L (ref 135–145)

## 2022-10-09 LAB — CBC
HCT: 30.8 % — ABNORMAL LOW (ref 36.0–46.0)
Hemoglobin: 9.8 g/dL — ABNORMAL LOW (ref 12.0–15.0)
MCH: 29.9 pg (ref 26.0–34.0)
MCHC: 31.8 g/dL (ref 30.0–36.0)
MCV: 93.9 fL (ref 80.0–100.0)
Platelets: 203 10*3/uL (ref 150–400)
RBC: 3.28 MIL/uL — ABNORMAL LOW (ref 3.87–5.11)
RDW: 14.6 % (ref 11.5–15.5)
WBC: 5 10*3/uL (ref 4.0–10.5)
nRBC: 0 % (ref 0.0–0.2)

## 2022-10-09 MED ORDER — POTASSIUM CHLORIDE CRYS ER 20 MEQ PO TBCR
40.0000 meq | EXTENDED_RELEASE_TABLET | Freq: Once | ORAL | Status: AC
  Administered 2022-10-09: 40 meq via ORAL
  Filled 2022-10-09: qty 2

## 2022-10-09 MED ORDER — POTASSIUM CHLORIDE 20 MEQ PO PACK
60.0000 meq | PACK | Freq: Once | ORAL | Status: AC
  Administered 2022-10-09: 60 meq via ORAL
  Filled 2022-10-09: qty 3

## 2022-10-09 MED ORDER — EMPAGLIFLOZIN 10 MG PO TABS
10.0000 mg | ORAL_TABLET | Freq: Every day | ORAL | Status: DC
  Administered 2022-10-09 – 2022-10-13 (×5): 10 mg via ORAL
  Filled 2022-10-09 (×5): qty 1

## 2022-10-09 MED ORDER — FUROSEMIDE 10 MG/ML IJ SOLN
20.0000 mg | Freq: Every day | INTRAMUSCULAR | Status: DC
  Administered 2022-10-10: 20 mg via INTRAVENOUS
  Filled 2022-10-09: qty 2

## 2022-10-09 MED ORDER — ASPIRIN 81 MG PO CHEW
81.0000 mg | CHEWABLE_TABLET | ORAL | Status: AC
  Administered 2022-10-10: 81 mg via ORAL
  Filled 2022-10-09: qty 1

## 2022-10-09 MED ORDER — SPIRONOLACTONE 25 MG PO TABS
25.0000 mg | ORAL_TABLET | Freq: Every day | ORAL | Status: DC
  Administered 2022-10-09 – 2022-10-13 (×5): 25 mg via ORAL
  Filled 2022-10-09 (×5): qty 1

## 2022-10-09 MED ORDER — SODIUM CHLORIDE 0.9 % IV SOLN: INTRAVENOUS | Status: DC

## 2022-10-09 MED ORDER — POTASSIUM CHLORIDE 20 MEQ PO PACK
60.0000 meq | PACK | Freq: Two times a day (BID) | ORAL | Status: DC
  Administered 2022-10-09: 60 meq via ORAL
  Filled 2022-10-09: qty 3

## 2022-10-09 NOTE — H&P (View-Only) (Signed)
DAILY PROGRESS NOTE   Patient Name: Samantha Bryant Date of Encounter: 10/09/2022 Cardiologist: None  Chief Complaint   Breathing is better  Patient Profile   67 yo female with obesity, hypertension, dyslipidemia, presented with 2 week history of DOE, cough, following a COVID infections a few weeks ago. We are asked to see for congestive heart failure.   Subjective   Significant diuresis overnight- now 3L negative. Creatinine minimally increased, but normal. Potassium low at 2.7. Echo yesterday shows significant LV dysfunction with LVEF 25-30%. Regional WMA's particularly at the apex, suggestive of possible Takatsubo cardiomyopathy. Small pericardial effusion.   Objective   Vitals:   10/08/22 1602 10/08/22 2009 10/08/22 2245 10/09/22 0351  BP: 133/84 94/60 92/65  111/78  Pulse: 67 73  75  Resp: (!) 22 18  18   Temp: 97.6 F (36.4 C) 98.4 F (36.9 C)  97.6 F (36.4 C)  TempSrc: Oral Oral  Oral  SpO2: 100% 98%  97%  Weight:      Height:        Intake/Output Summary (Last 24 hours) at 10/09/2022 1104 Last data filed at 10/09/2022 0600 Gross per 24 hour  Intake 240 ml  Output 2250 ml  Net -2010 ml   Filed Weights   10/08/22 0317  Weight: 117.9 kg    Physical Exam   General appearance: alert and morbidly obese Neck: JVD - 4 cm above sternal notch, no carotid bruit, and thyroid not enlarged, symmetric, no tenderness/mass/nodules Lungs: clear to auscultation bilaterally Heart: regular rate and rhythm Abdomen: soft, non-tender; bowel sounds normal; no masses,  no organomegaly Extremities: edema 1+  Pulses: 2+ and symmetric Skin: Skin color, texture, turgor normal. No rashes or lesions Neurologic: Grossly normal Psych: Pleasant  Inpatient Medications    Scheduled Meds:  carvedilol  6.25 mg Oral BID WC   enoxaparin (LOVENOX) injection  60 mg Subcutaneous Q24H   furosemide  20 mg Intravenous BID   potassium chloride  60 mEq Oral BID   rosuvastatin  40 mg Oral QHS     Continuous Infusions:   PRN Meds: acetaminophen **OR** acetaminophen, albuterol, benzonatate, ondansetron **OR** ondansetron (ZOFRAN) IV, traZODone   Labs   Results for orders placed or performed during the hospital encounter of 10/08/22 (from the past 48 hour(s))  Resp panel by RT-PCR (RSV, Flu A&B, Covid) Anterior Nasal Swab     Status: None   Collection Time: 10/08/22  3:21 AM   Specimen: Anterior Nasal Swab  Result Value Ref Range   SARS Coronavirus 2 by RT PCR NEGATIVE NEGATIVE    Comment: (NOTE) SARS-CoV-2 target nucleic acids are NOT DETECTED.  The SARS-CoV-2 RNA is generally detectable in upper respiratory specimens during the acute phase of infection. The lowest concentration of SARS-CoV-2 viral copies this assay can detect is 138 copies/mL. A negative result does not preclude SARS-Cov-2 infection and should not be used as the sole basis for treatment or other patient management decisions. A negative result may occur with  improper specimen collection/handling, submission of specimen other than nasopharyngeal swab, presence of viral mutation(s) within the areas targeted by this assay, and inadequate number of viral copies(<138 copies/mL). A negative result must be combined with clinical observations, patient history, and epidemiological information. The expected result is Negative.  Fact Sheet for Patients:  BloggerCourse.com  Fact Sheet for Healthcare Providers:  SeriousBroker.it  This test is no t yet approved or cleared by the [place] FDA and  has been authorized for detection and/or  diagnosis of SARS-CoV-2 by FDA under an Emergency Use Authorization (EUA). This EUA will remain  in effect (meaning this test can be used) for the duration of the COVID-19 declaration under Section 564(b)(1) of the Act, 21 U.S.C.section 360bbb-3(b)(1), unless the authorization is terminated  or revoked sooner.        Influenza A by PCR NEGATIVE NEGATIVE   Influenza B by PCR NEGATIVE NEGATIVE    Comment: (NOTE) The Xpert Xpress SARS-CoV-2/FLU/RSV plus assay is intended as an aid in the diagnosis of influenza from Nasopharyngeal swab specimens and should not be used as a sole basis for treatment. Nasal washings and aspirates are unacceptable for Xpert Xpress SARS-CoV-2/FLU/RSV testing.  Fact Sheet for Patients: BloggerCourse.com  Fact Sheet for Healthcare Providers: SeriousBroker.it  This test is not yet approved or cleared by the [place] FDA and has been authorized for detection and/or diagnosis of SARS-CoV-2 by FDA under an Emergency Use Authorization (EUA). This EUA will remain in effect (meaning this test can be used) for the duration of the COVID-19 declaration under Section 564(b)(1) of the Act, 21 U.S.C. section 360bbb-3(b)(1), unless the authorization is terminated or revoked.     Resp Syncytial Virus by PCR NEGATIVE NEGATIVE    Comment: (NOTE) Fact Sheet for Patients: BloggerCourse.com  Fact Sheet for Healthcare Providers: SeriousBroker.it  This test is not yet approved or cleared by the [place] FDA and has been authorized for detection and/or diagnosis of SARS-CoV-2 by FDA under an Emergency Use Authorization (EUA). This EUA will remain in effect (meaning this test can be used) for the duration of the COVID-19 declaration under Section 564(b)(1) of the Act, 21 U.S.C. section 360bbb-3(b)(1), unless the authorization is terminated or revoked.  Performed at [facility], [address], [place], [place] [address]   Comprehensive metabolic panel     Status: Abnormal   Collection Time: 10/08/22  4:01 AM  Result Value Ref Range   Sodium 143 135 - 145 mmol/L   Potassium 3.7 3.5 - 5.1 mmol/L   Chloride 108 98 - 111 mmol/L   CO2 25 22 - 32 mmol/L    Glucose, Bld 110 (H) 70 - 99 mg/dL    Comment: Glucose reference range applies only to samples taken after fasting for at least 8 hours.   BUN 8 8 - 23 mg/dL   Creatinine, Ser 8.29 0.44 - 1.00 mg/dL   Calcium 8.7 (L) 8.9 - 10.3 mg/dL   Total Protein 6.6 6.5 - 8.1 g/dL   Albumin 3.9 3.5 - 5.0 g/dL   AST 20 15 - 41 U/L   ALT 18 0 - 44 U/L   Alkaline Phosphatase 57 38 - 126 U/L   Total Bilirubin 0.8 0.3 - 1.2 mg/dL   GFR, Estimated >56 >21 mL/min    Comment: (NOTE) Calculated using the CKD-EPI Creatinine Equation (2021)    Anion gap 10 5 - 15    Comment: Performed at [facility], [address] [place], [place] [address]  Brain natriuretic peptide     Status: Abnormal   Collection Time: 10/08/22  4:01 AM  Result Value Ref Range   B Natriuretic Peptide 904.2 (H) 0.0 - 100.0 pg/mL    Comment: Performed at [facility], [address] [place], [place] [address]  Troponin I (High Sensitivity)     Status: Abnormal   Collection Time: 10/08/22  4:01 AM  Result Value Ref Range   Troponin I (High Sensitivity) 118 (HH) <18  ng/L    Comment: CRITICAL RESULT CALLED TO, READ BACK BY AND VERIFIED WITH AVREU D. 0451 10/08/22 MCLEAN K. (NOTE) Elevated high sensitivity troponin I (hsTnI) values and significant  changes across serial measurements may suggest ACS but many other  chronic and acute conditions are known to elevate hsTnI results.  Refer to the "Links" section for chest pain algorithms and additional  guidance. Performed at [facility], [address], [place], [place] [address]   CBC with Differential     Status: Abnormal   Collection Time: 10/08/22  4:01 AM  Result Value Ref Range   WBC 6.6 4.0 - 10.5 K/uL   RBC 3.45 (L) 3.87 - 5.11 MIL/uL   Hemoglobin 10.3 (L) 12.0 - 15.0 g/dL   HCT 28.4 (L) 13.2 - 44.0 %   MCV 93.0 80.0 - 100.0 fL   MCH 29.9 26.0 - 34.0 pg   MCHC 32.1 30.0 - 36.0 g/dL   RDW 10.2 72.5 - 36.6 %    Platelets 214 150 - 400 K/uL   nRBC 0.0 0.0 - 0.2 %   Neutrophils Relative % 48 %   Neutro Abs 3.1 1.7 - 7.7 K/uL   Lymphocytes Relative 42 %   Lymphs Abs 2.8 0.7 - 4.0 K/uL   Monocytes Relative 7 %   Monocytes Absolute 0.5 0.1 - 1.0 K/uL   Eosinophils Relative 3 %   Eosinophils Absolute 0.2 0.0 - 0.5 K/uL   Basophils Relative 0 %   Basophils Absolute 0.0 0.0 - 0.1 K/uL   Immature Granulocytes 0 %   Abs Immature Granulocytes 0.02 0.00 - 0.07 K/uL    Comment: Performed at [facility], [address], [place], [place] [address]  Troponin I (High Sensitivity)     Status: Abnormal   Collection Time: 10/08/22  5:04 AM  Result Value Ref Range   Troponin I (High Sensitivity) 120 (HH) <18 ng/L    Comment: CRITICAL RESULT CALLED TO, READ BACK BY AND VERIFIED WITH ABREU, D. RN AT [phone] ON 10/08/2022 BY MECIAL J. (NOTE) Elevated high sensitivity troponin I (hsTnI) values and significant  changes across serial measurements may suggest ACS but many other  chronic and acute conditions are known to elevate hsTnI results.  Refer to the "Links" section for chest pain algorithms and additional  guidance. Performed at [facility], [address] [place], [place] [address]   Lipid panel     Status: Abnormal   Collection Time: 10/08/22  5:04 AM  Result Value Ref Range   Cholesterol 255 (H) 0 - 200 mg/dL   Triglycerides 87 <756 mg/dL   HDL 55 >43 mg/dL   Total CHOL/HDL Ratio 4.6 RATIO   VLDL 17 0 - 40 mg/dL   LDL Cholesterol 329 (H) 0 - 99 mg/dL    Comment:        Total Cholesterol/HDL:CHD Risk Coronary Heart Disease Risk Table                     Men   Women  1/2 Average Risk   3.4   3.3  Average Risk       5.0   4.4  2 X Average Risk   9.6   7.1  3 X Average Risk  23.4   11.0        Use the calculated Patient Ratio above and the CHD Risk Table to determine the patient's CHD Risk.  ATP III CLASSIFICATION (LDL):  <100     mg/dL   Optimal   413-244  mg/dL   Near or Above                    Optimal  130-159  mg/dL   Borderline  010-272  mg/dL   High  >536     mg/dL   Very High Performed at [facility], [address], [place], [place] [address]   HIV Antibody (routine testing w rflx)     Status: None   Collection Time: 10/08/22  7:45 AM  Result Value Ref Range   HIV Screen 4th Generation wRfx Non Reactive Non Reactive    Comment: Performed at [facility], [address] [place], [place] [address]  Basic metabolic panel     Status: Abnormal   Collection Time: 10/09/22  4:37 AM  Result Value Ref Range   Sodium 140 135 - 145 mmol/L   Potassium 2.7 (LL) 3.5 - 5.1 mmol/L    Comment: CRITICAL RESULT CALLED TO, READ BACK BY AND VERIFIED WITH KIM, P. RN AT 0545 ON 10/09/2022 BY MECIAL J.    Chloride 104 98 - 111 mmol/L   CO2 27 22 - 32 mmol/L   Glucose, Bld 99 70 - 99 mg/dL    Comment: Glucose reference range applies only to samples taken after fasting for at least 8 hours.   BUN 6 (L) 8 - 23 mg/dL   Creatinine, Ser 9.56 0.44 - 1.00 mg/dL   Calcium 8.4 (L) 8.9 - 10.3 mg/dL   GFR, Estimated >38 >75 mL/min    Comment: (NOTE) Calculated using the CKD-EPI Creatinine Equation (2021)    Anion gap 9 5 - 15    Comment: Performed at [facility], [address], [place], [place] [address]  CBC     Status: Abnormal   Collection Time: 10/09/22  4:37 AM  Result Value Ref Range   WBC 5.0 4.0 - 10.5 K/uL   RBC 3.28 (L) 3.87 - 5.11 MIL/uL   Hemoglobin 9.8 (L) 12.0 - 15.0 g/dL   HCT 95.1 (L) 88.4 - 16.6 %   MCV 93.9 80.0 - 100.0 fL   MCH 29.9 26.0 - 34.0 pg   MCHC 31.8 30.0 - 36.0 g/dL   RDW 06.3 01.6 - 01.0 %   Platelets 203 150 - 400 K/uL   nRBC 0.0 0.0 - 0.2 %    Comment: Performed at [facility], [address], [place], [place] [address]    ECG   N/A  Telemetry   Sinus rhythm - Personally Reviewed  Radiology    ECHOCARDIOGRAM COMPLETE  Result  Date: 10/08/2022    ECHOCARDIOGRAM REPORT   Patient Name:   PATRIC CHRISTAKOS Date of Exam: 10/08/2022 Medical Rec #:  [id number]    Height:       65.0 in Accession #:    [id number]   Weight:       260.0 lb Date of Birth:  05-May-1955    BSA:          2.211 m Patient Age:    67 years     BP:           110/82 mmHg Patient Gender: F            HR:           78 bpm. Exam Location:  Inpatient Procedure: 2D Echo, Color Doppler and  Cardiac Doppler                             MODIFIED REPORT: This report was modified by Olga Millers MD on 10/08/2022 due to Change.  Indications:     I50.40* Unspecified combined systolic (congestive) and                  diastolic (congestive) heart failure  History:         Patient has no prior history of Echocardiogram examinations.                  CHF, Signs/Symptoms:Dyspnea and Shortness of Breath; Risk                  Factors:Dyslipidemia and Hypertension.  Sonographer:     Sheralyn Boatman RDCS Referring Phys:  [id number] MIR Judie Petit [facility] Diagnosing Phys: Olga Millers MD  Sonographer Comments: Patient is obese. IMPRESSIONS  1. Severe hypokinesis of the entire apex with overall severe LV dysfunction; pattern suggestive of stress induced cardiomyopathy.  2. Left ventricular ejection fraction, by estimation, is 25 to 30%. The left ventricle has severely decreased function. The left ventricle demonstrates regional wall motion abnormalities (see scoring diagram/findings for description). The left ventricular internal cavity size was moderately dilated. Left ventricular diastolic parameters are consistent with Grade II diastolic dysfunction (pseudonormalization).  3. Right ventricular systolic function is normal. The right ventricular size is normal.  4. Left atrial size was mildly dilated.  5. A small pericardial effusion is present. The pericardial effusion is circumferential. There is no evidence of cardiac tamponade.  6. The mitral valve is grossly normal. Mild mitral valve regurgitation. No evidence  of mitral stenosis.  7. The aortic valve is tricuspid. Aortic valve regurgitation is not visualized. No aortic stenosis is present.  8. The inferior vena cava is dilated in size with <50% respiratory variability, suggesting right atrial pressure of 15 mmHg. Comparison(s): No prior Echocardiogram. FINDINGS  Left Ventricle: Left ventricular ejection fraction, by estimation, is 25 to 30%. The left ventricle has severely decreased function. The left ventricle demonstrates regional wall motion abnormalities. The left ventricular internal cavity size was moderately dilated. There is no left ventricular hypertrophy. Left ventricular diastolic parameters are consistent with Grade II diastolic dysfunction (pseudonormalization). Right Ventricle: The right ventricular size is normal. Right ventricular systolic function is normal. Left Atrium: Left atrial size was mildly dilated. Right Atrium: Right atrial size was normal in size. Pericardium: A small pericardial effusion is present. The pericardial effusion is circumferential. There is no evidence of cardiac tamponade. Mitral Valve: The mitral valve is grossly normal. Mild mitral valve regurgitation. No evidence of mitral valve stenosis. Tricuspid Valve: The tricuspid valve is normal in structure. Tricuspid valve regurgitation is trivial. No evidence of tricuspid stenosis. Aortic Valve: The aortic valve is tricuspid. Aortic valve regurgitation is not visualized. No aortic stenosis is present. Pulmonic Valve: The pulmonic valve was normal in structure. Pulmonic valve regurgitation is not visualized. No evidence of pulmonic stenosis. Aorta: The aortic root and ascending aorta are structurally normal, with no evidence of dilitation. Venous: The inferior vena cava is dilated in size with less than 50% respiratory variability, suggesting right atrial pressure of 15 mmHg. IAS/Shunts: No atrial level shunt detected by color flow Doppler. Additional Comments: Severe hypokinesis of the  entire apex with overall severe LV dysfunction; pattern suggestive of stress induced cardiomyopathy.  LEFT VENTRICLE PLAX 2D LVIDd:  5.20 cm      Diastology LVIDs:         4.00 cm      LV e' medial:    7.18 cm/s LV PW:         1.00 cm      LV E/e' medial:  13.1 LV IVS:        0.90 cm      LV e' lateral:   9.46 cm/s LVOT diam:     2.00 cm      LV E/e' lateral: 9.9 LV SV:         66 LV SV Index:   30 LVOT Area:     3.14 cm  LV Volumes (MOD) LV vol d, MOD A2C: 114.0 ml LV vol d, MOD A4C: 105.0 ml LV vol s, MOD A2C: 80.0 ml LV vol s, MOD A4C: 63.4 ml LV SV MOD A2C:     34.0 ml LV SV MOD A4C:     105.0 ml LV SV MOD BP:      41.9 ml RIGHT VENTRICLE            IVC RV S prime:     9.90 cm/s  IVC diam: 2.50 cm TAPSE (M-mode): 1.4 cm LEFT ATRIUM             Index        RIGHT ATRIUM           Index LA diam:        3.80 cm 1.72 cm/m   RA Area:     11.60 cm LA Vol (A2C):   74.6 ml 33.73 ml/m  RA Volume:   26.90 ml  12.16 ml/m LA Vol (A4C):   38.3 ml 17.32 ml/m LA Biplane Vol: 56.1 ml 25.37 ml/m  AORTIC VALVE LVOT Vmax:   102.00 cm/s LVOT Vmean:  64.900 cm/s LVOT VTI:    0.209 m  AORTA Ao Root diam: 3.60 cm Ao Asc diam:  3.20 cm MITRAL VALVE MV Area (PHT): 3.72 cm    SHUNTS MV Decel Time: 204 msec    Systemic VTI:  0.21 m MV E velocity: 94.00 cm/s  Systemic Diam: 2.00 cm MV A velocity: 45.30 cm/s MV E/A ratio:  2.08 Olga Millers MD Electronically signed by Olga Millers MD Signature Date/Time: 10/08/2022/2:50:25 PM    Final (Updated)    CT Angio Chest PE W/Cm &/Or Wo Cm  Result Date: 10/08/2022 CLINICAL DATA:  67 year old female history of shortness of breath and unproductive cough. EXAM: CT ANGIOGRAPHY CHEST WITH CONTRAST TECHNIQUE: Multidetector CT imaging of the chest was performed using the standard protocol during bolus administration of intravenous contrast. Multiplanar CT image reconstructions and MIPs were obtained to evaluate the vascular anatomy. RADIATION DOSE REDUCTION: This exam was performed  according to the departmental dose-optimization program which includes automated exposure control, adjustment of the mA and/or kV according to patient size and/or use of iterative reconstruction technique. CONTRAST:  75mL OMNIPAQUE IOHEXOL 350 MG/ML SOLN COMPARISON:  Chest CTA 11/24/2004. FINDINGS: Cardiovascular: There are no filling defects within the pulmonary arterial tree to suggest pulmonary embolism. Heart size is normal. There is no significant pericardial fluid, thickening or pericardial calcification. Aortic atherosclerosis. No definite coronary artery calcifications. Mediastinum/Nodes: No pathologically enlarged mediastinal or hilar lymph nodes. Esophagus is unremarkable in appearance. No axillary lymphadenopathy. Lungs/Pleura: Diffuse bronchial wall thickening and mild thickening of the peribronchovascular interstitium. No acute consolidative airspace disease. No pleural effusions. No suspicious appearing pulmonary nodules or masses are noted.  Upper Abdomen: 3 mm nonobstructive calculus in the upper pole collecting system of the right kidney. Musculoskeletal: There are no aggressive appearing lytic or blastic lesions noted in the visualized portions of the skeleton. Review of the MIP images confirms the above findings. IMPRESSION: 1. No evidence of pulmonary embolism. 2. Diffuse bronchial wall thickening and mild thickening of the peribronchovascular interstitium, concerning for probable acute bronchitis. 3. Aortic atherosclerosis. 4. 3 mm nonobstructive calculus in the upper pole collecting system of the right kidney incidentally noted. Aortic Atherosclerosis (ICD10-I70.0). Electronically Signed   By: Trudie Reed M.D.   On: 10/08/2022 05:38   DG Chest Port 1 View  Result Date: 10/08/2022 CLINICAL DATA:  Shortness of breath EXAM: PORTABLE CHEST 1 VIEW COMPARISON:  09/16/2022 FINDINGS: The heart size and mediastinal contours are within normal limits. Both lungs are clear. The visualized skeletal  structures are unremarkable. IMPRESSION: No active disease. Electronically Signed   By: Alcide Clever M.D.   On: 10/08/2022 03:34    Cardiac Studies   See echo above  Assessment   Principal Problem:   Acute CHF (congestive heart failure) (HCC) Active Problems:   Dyspnea on exertion   Dyslipidemia, goal LDL below 70   HTN (hypertension)   Plan   Acute systolic congestive heart failure (LVEF 25-30%, NYHA Class IV symptoms) Presentation of acute dyspnea on exertion following COVID positive bronchitis.  She subsequently has tested negative for presented and was hypoxemic with an elevated BNP consistent with congestive heart failure.   Good response to lasix - was naaive. Will decrease to 20 mg IV daily since she had substantial output.  Replete potassium to >4.0, check magnesium  Possible viral cardiomyopathy, however, strong family history of premature CAD in brother and significantly elevated cholesterol - possibly elevated LP(a) - pending. Minimal coronary calcifications. Would need to exclude CAD - would recommend [facility] when clinically compensated. Add jardiance 10 mg daily Add spironolactone 25 mg daily Dyslipidemia TC 255, TG 87, HDL 55 and LDL 183 -noted to have aortic atherosclerosis on imaging.   Continue high intensity rosuvastatin 40 mg at bedtime LP(a) pending Hypertension Blood pressure reasonably well-controlled to mildly elevated.   Continue coreg 6.25 mg BID  Informed Consent   Shared Decision Making/Informed Consent The risks [stroke (1 in 1000), death (1 in 1000), kidney failure [usually temporary] (1 in 500), bleeding (1 in 200), allergic reaction [possibly serious] (1 in 200)], benefits (diagnostic support and management of coronary artery disease) and alternatives of a cardiac catheterization were discussed in detail with Ms. Matchett and she is willing to proceed.    Time Spent Directly with Patient:  I have spent a total of 25 minutes with the patient reviewing  hospital notes, telemetry, EKGs, labs and examining the patient as well as establishing an assessment and plan that was discussed personally with the patient.  > 50% of time was spent in direct patient care.  Length of Stay:  LOS: 1 day   Chrystie Nose, MD, [facility], FACP  [place]  [facility]  Medical Director of the Advanced Lipid Disorders &  Cardiovascular Risk Reduction Clinic Diplomate of the American Board of Clinical Lipidology Attending Cardiologist  Direct Dial: [phone]  Fax: [phone]  Website:  [email or web address] Nicely Bertrice Leder 10/09/2022, 11:04 AM

## 2022-10-09 NOTE — Progress Notes (Addendum)
DAILY PROGRESS NOTE   Patient Name: Samantha Bryant Date of Encounter: 10/09/2022 Cardiologist: None  Chief Complaint   Breathing is better  Patient Profile   67 yo female with obesity, hypertension, dyslipidemia, presented with 2 week history of DOE, cough, following a COVID infections a few weeks ago. We are asked to see for congestive heart failure.   Subjective   Significant diuresis overnight- now 3L negative. Creatinine minimally increased, but normal. Potassium low at 2.7. Echo yesterday shows significant LV dysfunction with LVEF 25-30%. Regional WMA's particularly at the apex, suggestive of possible Takatsubo cardiomyopathy. Small pericardial effusion.   Objective   Vitals:   10/08/22 1602 10/08/22 2009 10/08/22 2245 10/09/22 0351  BP: 133/84 94/60 92/65  111/78  Pulse: 67 73  75  Resp: (!) 22 18  18   Temp: 97.6 F (36.4 C) 98.4 F (36.9 C)  97.6 F (36.4 C)  TempSrc: Oral Oral  Oral  SpO2: 100% 98%  97%  Weight:      Height:        Intake/Output Summary (Last 24 hours) at 10/09/2022 1104 Last data filed at 10/09/2022 0600 Gross per 24 hour  Intake 240 ml  Output 2250 ml  Net -2010 ml   Filed Weights   10/08/22 0317  Weight: 117.9 kg    Physical Exam   General appearance: alert and morbidly obese Neck: JVD - 4 cm above sternal notch, no carotid bruit, and thyroid not enlarged, symmetric, no tenderness/mass/nodules Lungs: clear to auscultation bilaterally Heart: regular rate and rhythm Abdomen: soft, non-tender; bowel sounds normal; no masses,  no organomegaly Extremities: edema 1+  Pulses: 2+ and symmetric Skin: Skin color, texture, turgor normal. No rashes or lesions Neurologic: Grossly normal Psych: Pleasant  Inpatient Medications    Scheduled Meds:  carvedilol  6.25 mg Oral BID WC   enoxaparin (LOVENOX) injection  60 mg Subcutaneous Q24H   furosemide  20 mg Intravenous BID   potassium chloride  60 mEq Oral BID   rosuvastatin  40 mg Oral QHS     Continuous Infusions:   PRN Meds: acetaminophen **OR** acetaminophen, albuterol, benzonatate, ondansetron **OR** ondansetron (ZOFRAN) IV, traZODone   Labs   Results for orders placed or performed during the hospital encounter of 10/08/22 (from the past 48 hour(s))  Resp panel by RT-PCR (RSV, Flu A&B, Covid) Anterior Nasal Swab     Status: None   Collection Time: 10/08/22  3:21 AM   Specimen: Anterior Nasal Swab  Result Value Ref Range   SARS Coronavirus 2 by RT PCR NEGATIVE NEGATIVE    Comment: (NOTE) SARS-CoV-2 target nucleic acids are NOT DETECTED.  The SARS-CoV-2 RNA is generally detectable in upper respiratory specimens during the acute phase of infection. The lowest concentration of SARS-CoV-2 viral copies this assay can detect is 138 copies/mL. A negative result does not preclude SARS-Cov-2 infection and should not be used as the sole basis for treatment or other patient management decisions. A negative result may occur with  improper specimen collection/handling, submission of specimen other than nasopharyngeal swab, presence of viral mutation(s) within the areas targeted by this assay, and inadequate number of viral copies(<138 copies/mL). A negative result must be combined with clinical observations, patient history, and epidemiological information. The expected result is Negative.  Fact Sheet for Patients:  BloggerCourse.com  Fact Sheet for Healthcare Providers:  SeriousBroker.it  This test is no t yet approved or cleared by the [place] FDA and  has been authorized for detection and/or  diagnosis of SARS-CoV-2 by FDA under an Emergency Use Authorization (EUA). This EUA will remain  in effect (meaning this test can be used) for the duration of the COVID-19 declaration under Section 564(b)(1) of the Act, 21 U.S.C.section 360bbb-3(b)(1), unless the authorization is terminated  or revoked sooner.        Influenza A by PCR NEGATIVE NEGATIVE   Influenza B by PCR NEGATIVE NEGATIVE    Comment: (NOTE) The Xpert Xpress SARS-CoV-2/FLU/RSV plus assay is intended as an aid in the diagnosis of influenza from Nasopharyngeal swab specimens and should not be used as a sole basis for treatment. Nasal washings and aspirates are unacceptable for Xpert Xpress SARS-CoV-2/FLU/RSV testing.  Fact Sheet for Patients: BloggerCourse.com  Fact Sheet for Healthcare Providers: SeriousBroker.it  This test is not yet approved or cleared by the [place] FDA and has been authorized for detection and/or diagnosis of SARS-CoV-2 by FDA under an Emergency Use Authorization (EUA). This EUA will remain in effect (meaning this test can be used) for the duration of the COVID-19 declaration under Section 564(b)(1) of the Act, 21 U.S.C. section 360bbb-3(b)(1), unless the authorization is terminated or revoked.     Resp Syncytial Virus by PCR NEGATIVE NEGATIVE    Comment: (NOTE) Fact Sheet for Patients: BloggerCourse.com  Fact Sheet for Healthcare Providers: SeriousBroker.it  This test is not yet approved or cleared by the [place] FDA and has been authorized for detection and/or diagnosis of SARS-CoV-2 by FDA under an Emergency Use Authorization (EUA). This EUA will remain in effect (meaning this test can be used) for the duration of the COVID-19 declaration under Section 564(b)(1) of the Act, 21 U.S.C. section 360bbb-3(b)(1), unless the authorization is terminated or revoked.  Performed at [facility], [address], [place], [place] [address]   Comprehensive metabolic panel     Status: Abnormal   Collection Time: 10/08/22  4:01 AM  Result Value Ref Range   Sodium 143 135 - 145 mmol/L   Potassium 3.7 3.5 - 5.1 mmol/L   Chloride 108 98 - 111 mmol/L   CO2 25 22 - 32 mmol/L    Glucose, Bld 110 (H) 70 - 99 mg/dL    Comment: Glucose reference range applies only to samples taken after fasting for at least 8 hours.   BUN 8 8 - 23 mg/dL   Creatinine, Ser 8.29 0.44 - 1.00 mg/dL   Calcium 8.7 (L) 8.9 - 10.3 mg/dL   Total Protein 6.6 6.5 - 8.1 g/dL   Albumin 3.9 3.5 - 5.0 g/dL   AST 20 15 - 41 U/L   ALT 18 0 - 44 U/L   Alkaline Phosphatase 57 38 - 126 U/L   Total Bilirubin 0.8 0.3 - 1.2 mg/dL   GFR, Estimated >56 >21 mL/min    Comment: (NOTE) Calculated using the CKD-EPI Creatinine Equation (2021)    Anion gap 10 5 - 15    Comment: Performed at [facility], [address] [place], [place] [address]  Brain natriuretic peptide     Status: Abnormal   Collection Time: 10/08/22  4:01 AM  Result Value Ref Range   B Natriuretic Peptide 904.2 (H) 0.0 - 100.0 pg/mL    Comment: Performed at [facility], [address] [place], [place] [address]  Troponin I (High Sensitivity)     Status: Abnormal   Collection Time: 10/08/22  4:01 AM  Result Value Ref Range   Troponin I (High Sensitivity) 118 (HH) <18  ng/L    Comment: CRITICAL RESULT CALLED TO, READ BACK BY AND VERIFIED WITH AVREU D. 0451 10/08/22 MCLEAN K. (NOTE) Elevated high sensitivity troponin I (hsTnI) values and significant  changes across serial measurements may suggest ACS but many other  chronic and acute conditions are known to elevate hsTnI results.  Refer to the "Links" section for chest pain algorithms and additional  guidance. Performed at [facility], [address], [place], [place] [address]   CBC with Differential     Status: Abnormal   Collection Time: 10/08/22  4:01 AM  Result Value Ref Range   WBC 6.6 4.0 - 10.5 K/uL   RBC 3.45 (L) 3.87 - 5.11 MIL/uL   Hemoglobin 10.3 (L) 12.0 - 15.0 g/dL   HCT 28.4 (L) 13.2 - 44.0 %   MCV 93.0 80.0 - 100.0 fL   MCH 29.9 26.0 - 34.0 pg   MCHC 32.1 30.0 - 36.0 g/dL   RDW 10.2 72.5 - 36.6 %    Platelets 214 150 - 400 K/uL   nRBC 0.0 0.0 - 0.2 %   Neutrophils Relative % 48 %   Neutro Abs 3.1 1.7 - 7.7 K/uL   Lymphocytes Relative 42 %   Lymphs Abs 2.8 0.7 - 4.0 K/uL   Monocytes Relative 7 %   Monocytes Absolute 0.5 0.1 - 1.0 K/uL   Eosinophils Relative 3 %   Eosinophils Absolute 0.2 0.0 - 0.5 K/uL   Basophils Relative 0 %   Basophils Absolute 0.0 0.0 - 0.1 K/uL   Immature Granulocytes 0 %   Abs Immature Granulocytes 0.02 0.00 - 0.07 K/uL    Comment: Performed at [facility], [address], [place], [place] [address]  Troponin I (High Sensitivity)     Status: Abnormal   Collection Time: 10/08/22  5:04 AM  Result Value Ref Range   Troponin I (High Sensitivity) 120 (HH) <18 ng/L    Comment: CRITICAL RESULT CALLED TO, READ BACK BY AND VERIFIED WITH ABREU, D. RN AT [phone] ON 10/08/2022 BY MECIAL J. (NOTE) Elevated high sensitivity troponin I (hsTnI) values and significant  changes across serial measurements may suggest ACS but many other  chronic and acute conditions are known to elevate hsTnI results.  Refer to the "Links" section for chest pain algorithms and additional  guidance. Performed at [facility], [address] [place], [place] [address]   Lipid panel     Status: Abnormal   Collection Time: 10/08/22  5:04 AM  Result Value Ref Range   Cholesterol 255 (H) 0 - 200 mg/dL   Triglycerides 87 <756 mg/dL   HDL 55 >43 mg/dL   Total CHOL/HDL Ratio 4.6 RATIO   VLDL 17 0 - 40 mg/dL   LDL Cholesterol 329 (H) 0 - 99 mg/dL    Comment:        Total Cholesterol/HDL:CHD Risk Coronary Heart Disease Risk Table                     Men   Women  1/2 Average Risk   3.4   3.3  Average Risk       5.0   4.4  2 X Average Risk   9.6   7.1  3 X Average Risk  23.4   11.0        Use the calculated Patient Ratio above and the CHD Risk Table to determine the patient's CHD Risk.  ATP III CLASSIFICATION (LDL):  <100     mg/dL   Optimal   413-244  mg/dL   Near or Above                    Optimal  130-159  mg/dL   Borderline  010-272  mg/dL   High  >536     mg/dL   Very High Performed at [facility], [address], [place], [place] [address]   HIV Antibody (routine testing w rflx)     Status: None   Collection Time: 10/08/22  7:45 AM  Result Value Ref Range   HIV Screen 4th Generation wRfx Non Reactive Non Reactive    Comment: Performed at [facility], [address] [place], [place] [address]  Basic metabolic panel     Status: Abnormal   Collection Time: 10/09/22  4:37 AM  Result Value Ref Range   Sodium 140 135 - 145 mmol/L   Potassium 2.7 (LL) 3.5 - 5.1 mmol/L    Comment: CRITICAL RESULT CALLED TO, READ BACK BY AND VERIFIED WITH KIM, P. RN AT 0545 ON 10/09/2022 BY MECIAL J.    Chloride 104 98 - 111 mmol/L   CO2 27 22 - 32 mmol/L   Glucose, Bld 99 70 - 99 mg/dL    Comment: Glucose reference range applies only to samples taken after fasting for at least 8 hours.   BUN 6 (L) 8 - 23 mg/dL   Creatinine, Ser 9.56 0.44 - 1.00 mg/dL   Calcium 8.4 (L) 8.9 - 10.3 mg/dL   GFR, Estimated >38 >75 mL/min    Comment: (NOTE) Calculated using the CKD-EPI Creatinine Equation (2021)    Anion gap 9 5 - 15    Comment: Performed at [facility], [address], [place], [place] [address]  CBC     Status: Abnormal   Collection Time: 10/09/22  4:37 AM  Result Value Ref Range   WBC 5.0 4.0 - 10.5 K/uL   RBC 3.28 (L) 3.87 - 5.11 MIL/uL   Hemoglobin 9.8 (L) 12.0 - 15.0 g/dL   HCT 95.1 (L) 88.4 - 16.6 %   MCV 93.9 80.0 - 100.0 fL   MCH 29.9 26.0 - 34.0 pg   MCHC 31.8 30.0 - 36.0 g/dL   RDW 06.3 01.6 - 01.0 %   Platelets 203 150 - 400 K/uL   nRBC 0.0 0.0 - 0.2 %    Comment: Performed at [facility], [address], [place], [place] [address]    ECG   N/A  Telemetry   Sinus rhythm - Personally Reviewed  Radiology    ECHOCARDIOGRAM COMPLETE  Result  Date: 10/08/2022    ECHOCARDIOGRAM REPORT   Patient Name:   PATRIC CHRISTAKOS Date of Exam: 10/08/2022 Medical Rec #:  [id number]    Height:       65.0 in Accession #:    [id number]   Weight:       260.0 lb Date of Birth:  05-May-1955    BSA:          2.211 m Patient Age:    67 years     BP:           110/82 mmHg Patient Gender: F            HR:           78 bpm. Exam Location:  Inpatient Procedure: 2D Echo, Color Doppler and  Cardiac Doppler                             MODIFIED REPORT: This report was modified by Olga Millers MD on 10/08/2022 due to Change.  Indications:     I50.40* Unspecified combined systolic (congestive) and                  diastolic (congestive) heart failure  History:         Patient has no prior history of Echocardiogram examinations.                  CHF, Signs/Symptoms:Dyspnea and Shortness of Breath; Risk                  Factors:Dyslipidemia and Hypertension.  Sonographer:     Sheralyn Boatman RDCS Referring Phys:  [id number] MIR Judie Petit [facility] Diagnosing Phys: Olga Millers MD  Sonographer Comments: Patient is obese. IMPRESSIONS  1. Severe hypokinesis of the entire apex with overall severe LV dysfunction; pattern suggestive of stress induced cardiomyopathy.  2. Left ventricular ejection fraction, by estimation, is 25 to 30%. The left ventricle has severely decreased function. The left ventricle demonstrates regional wall motion abnormalities (see scoring diagram/findings for description). The left ventricular internal cavity size was moderately dilated. Left ventricular diastolic parameters are consistent with Grade II diastolic dysfunction (pseudonormalization).  3. Right ventricular systolic function is normal. The right ventricular size is normal.  4. Left atrial size was mildly dilated.  5. A small pericardial effusion is present. The pericardial effusion is circumferential. There is no evidence of cardiac tamponade.  6. The mitral valve is grossly normal. Mild mitral valve regurgitation. No evidence  of mitral stenosis.  7. The aortic valve is tricuspid. Aortic valve regurgitation is not visualized. No aortic stenosis is present.  8. The inferior vena cava is dilated in size with <50% respiratory variability, suggesting right atrial pressure of 15 mmHg. Comparison(s): No prior Echocardiogram. FINDINGS  Left Ventricle: Left ventricular ejection fraction, by estimation, is 25 to 30%. The left ventricle has severely decreased function. The left ventricle demonstrates regional wall motion abnormalities. The left ventricular internal cavity size was moderately dilated. There is no left ventricular hypertrophy. Left ventricular diastolic parameters are consistent with Grade II diastolic dysfunction (pseudonormalization). Right Ventricle: The right ventricular size is normal. Right ventricular systolic function is normal. Left Atrium: Left atrial size was mildly dilated. Right Atrium: Right atrial size was normal in size. Pericardium: A small pericardial effusion is present. The pericardial effusion is circumferential. There is no evidence of cardiac tamponade. Mitral Valve: The mitral valve is grossly normal. Mild mitral valve regurgitation. No evidence of mitral valve stenosis. Tricuspid Valve: The tricuspid valve is normal in structure. Tricuspid valve regurgitation is trivial. No evidence of tricuspid stenosis. Aortic Valve: The aortic valve is tricuspid. Aortic valve regurgitation is not visualized. No aortic stenosis is present. Pulmonic Valve: The pulmonic valve was normal in structure. Pulmonic valve regurgitation is not visualized. No evidence of pulmonic stenosis. Aorta: The aortic root and ascending aorta are structurally normal, with no evidence of dilitation. Venous: The inferior vena cava is dilated in size with less than 50% respiratory variability, suggesting right atrial pressure of 15 mmHg. IAS/Shunts: No atrial level shunt detected by color flow Doppler. Additional Comments: Severe hypokinesis of the  entire apex with overall severe LV dysfunction; pattern suggestive of stress induced cardiomyopathy.  LEFT VENTRICLE PLAX 2D LVIDd:  5.20 cm      Diastology LVIDs:         4.00 cm      LV e' medial:    7.18 cm/s LV PW:         1.00 cm      LV E/e' medial:  13.1 LV IVS:        0.90 cm      LV e' lateral:   9.46 cm/s LVOT diam:     2.00 cm      LV E/e' lateral: 9.9 LV SV:         66 LV SV Index:   30 LVOT Area:     3.14 cm  LV Volumes (MOD) LV vol d, MOD A2C: 114.0 ml LV vol d, MOD A4C: 105.0 ml LV vol s, MOD A2C: 80.0 ml LV vol s, MOD A4C: 63.4 ml LV SV MOD A2C:     34.0 ml LV SV MOD A4C:     105.0 ml LV SV MOD BP:      41.9 ml RIGHT VENTRICLE            IVC RV S prime:     9.90 cm/s  IVC diam: 2.50 cm TAPSE (M-mode): 1.4 cm LEFT ATRIUM             Index        RIGHT ATRIUM           Index LA diam:        3.80 cm 1.72 cm/m   RA Area:     11.60 cm LA Vol (A2C):   74.6 ml 33.73 ml/m  RA Volume:   26.90 ml  12.16 ml/m LA Vol (A4C):   38.3 ml 17.32 ml/m LA Biplane Vol: 56.1 ml 25.37 ml/m  AORTIC VALVE LVOT Vmax:   102.00 cm/s LVOT Vmean:  64.900 cm/s LVOT VTI:    0.209 m  AORTA Ao Root diam: 3.60 cm Ao Asc diam:  3.20 cm MITRAL VALVE MV Area (PHT): 3.72 cm    SHUNTS MV Decel Time: 204 msec    Systemic VTI:  0.21 m MV E velocity: 94.00 cm/s  Systemic Diam: 2.00 cm MV A velocity: 45.30 cm/s MV E/A ratio:  2.08 Olga Millers MD Electronically signed by Olga Millers MD Signature Date/Time: 10/08/2022/2:50:25 PM    Final (Updated)    CT Angio Chest PE W/Cm &/Or Wo Cm  Result Date: 10/08/2022 CLINICAL DATA:  67 year old female history of shortness of breath and unproductive cough. EXAM: CT ANGIOGRAPHY CHEST WITH CONTRAST TECHNIQUE: Multidetector CT imaging of the chest was performed using the standard protocol during bolus administration of intravenous contrast. Multiplanar CT image reconstructions and MIPs were obtained to evaluate the vascular anatomy. RADIATION DOSE REDUCTION: This exam was performed  according to the departmental dose-optimization program which includes automated exposure control, adjustment of the mA and/or kV according to patient size and/or use of iterative reconstruction technique. CONTRAST:  75mL OMNIPAQUE IOHEXOL 350 MG/ML SOLN COMPARISON:  Chest CTA 11/24/2004. FINDINGS: Cardiovascular: There are no filling defects within the pulmonary arterial tree to suggest pulmonary embolism. Heart size is normal. There is no significant pericardial fluid, thickening or pericardial calcification. Aortic atherosclerosis. No definite coronary artery calcifications. Mediastinum/Nodes: No pathologically enlarged mediastinal or hilar lymph nodes. Esophagus is unremarkable in appearance. No axillary lymphadenopathy. Lungs/Pleura: Diffuse bronchial wall thickening and mild thickening of the peribronchovascular interstitium. No acute consolidative airspace disease. No pleural effusions. No suspicious appearing pulmonary nodules or masses are noted.  Upper Abdomen: 3 mm nonobstructive calculus in the upper pole collecting system of the right kidney. Musculoskeletal: There are no aggressive appearing lytic or blastic lesions noted in the visualized portions of the skeleton. Review of the MIP images confirms the above findings. IMPRESSION: 1. No evidence of pulmonary embolism. 2. Diffuse bronchial wall thickening and mild thickening of the peribronchovascular interstitium, concerning for probable acute bronchitis. 3. Aortic atherosclerosis. 4. 3 mm nonobstructive calculus in the upper pole collecting system of the right kidney incidentally noted. Aortic Atherosclerosis (ICD10-I70.0). Electronically Signed   By: Trudie Reed M.D.   On: 10/08/2022 05:38   DG Chest Port 1 View  Result Date: 10/08/2022 CLINICAL DATA:  Shortness of breath EXAM: PORTABLE CHEST 1 VIEW COMPARISON:  09/16/2022 FINDINGS: The heart size and mediastinal contours are within normal limits. Both lungs are clear. The visualized skeletal  structures are unremarkable. IMPRESSION: No active disease. Electronically Signed   By: Alcide Clever M.D.   On: 10/08/2022 03:34    Cardiac Studies   See echo above  Assessment   Principal Problem:   Acute CHF (congestive heart failure) (HCC) Active Problems:   Dyspnea on exertion   Dyslipidemia, goal LDL below 70   HTN (hypertension)   Plan   Acute systolic congestive heart failure (LVEF 25-30%, NYHA Class IV symptoms) Presentation of acute dyspnea on exertion following COVID positive bronchitis.  She subsequently has tested negative for presented and was hypoxemic with an elevated BNP consistent with congestive heart failure.   Good response to lasix - was naaive. Will decrease to 20 mg IV daily since she had substantial output.  Replete potassium to >4.0, check magnesium  Possible viral cardiomyopathy, however, strong family history of premature CAD in brother and significantly elevated cholesterol - possibly elevated LP(a) - pending. Minimal coronary calcifications. Would need to exclude CAD - would recommend [facility] when clinically compensated. Add jardiance 10 mg daily Add spironolactone 25 mg daily Dyslipidemia TC 255, TG 87, HDL 55 and LDL 183 -noted to have aortic atherosclerosis on imaging.   Continue high intensity rosuvastatin 40 mg at bedtime LP(a) pending Hypertension Blood pressure reasonably well-controlled to mildly elevated.   Continue coreg 6.25 mg BID  Informed Consent   Shared Decision Making/Informed Consent The risks [stroke (1 in 1000), death (1 in 1000), kidney failure [usually temporary] (1 in 500), bleeding (1 in 200), allergic reaction [possibly serious] (1 in 200)], benefits (diagnostic support and management of coronary artery disease) and alternatives of a cardiac catheterization were discussed in detail with Ms. Matchett and she is willing to proceed.    Time Spent Directly with Patient:  I have spent a total of 25 minutes with the patient reviewing  hospital notes, telemetry, EKGs, labs and examining the patient as well as establishing an assessment and plan that was discussed personally with the patient.  > 50% of time was spent in direct patient care.  Length of Stay:  LOS: 1 day   Chrystie Nose, MD, [facility], FACP  [place]  [facility]  Medical Director of the Advanced Lipid Disorders &  Cardiovascular Risk Reduction Clinic Diplomate of the American Board of Clinical Lipidology Attending Cardiologist  Direct Dial: [phone]  Fax: [phone]  Website:  [email or web address] Nicely Bertrice Leder 10/09/2022, 11:04 AM

## 2022-10-09 NOTE — Progress Notes (Signed)
[facility] PROGRESS NOTE    Progress Note  Samantha Bryant  ZOX:[id number] DOB: 15-May-1955 DOA: 10/08/2022 PCP: Irven Coe, MD     Brief Narrative:   Samantha Bryant is an 67 y.o. female past medical history significant for obesity, essential hypertension admitted to heart for heart failure and hypoxic with ambulation, CTA showed evidence of acute bronchitis, hemoglobin of 10 BNP of 900.   Assessment/Plan:   Acute systolic heart failure: Cont low-dose Coreg and IV Lasix. 2D echo has been sent. Monitor electrolytes and replete as needed. Continue strict I's and O's and daily weights. Cardiology was consulted which agree with management. Negative about 3-1/2 L 2D echo showed an EF of 25 to 30%, left ventricle shows regional wall motion abnormalities, grade 2 diastolic dysfunction right ventricular systolic function is preserved.  No small pericardial effusion. Further management per cardiology.  Elevated troponins: Cardiac biomarkers are basically flat.  Hypokalemia: Repleted orally recheck in the morning. Elevated troponins: Likely due to demand ischemia in the setting of her heart failure exacerbation.  Dyslipidemia, goal LDL below 70 Continue statins.  Normocytic anemia: Will need follow-up with PCP as an outpatient.    DVT prophylaxis: lovenox Family Communication:none Status is: Inpatient Remains inpatient appropriate because: Acute systolic heart failure    Code Status:     Code Status Orders  (From admission, onward)           Start     Ordered   10/08/22 0714  Full code  Continuous       Question:  By:  Answer:  Consent: discussion documented in EHR   10/08/22 0714           Code Status History     This patient has a current code status but no historical code status.         IV Access:   Peripheral IV   Procedures and diagnostic studies:   ECHOCARDIOGRAM COMPLETE  Result Date: 10/08/2022    ECHOCARDIOGRAM REPORT   Patient  Name:   Samantha Bryant Date of Exam: 10/08/2022 Medical Rec #:  [id number]    Height:       65.0 in Accession #:    [id number]   Weight:       260.0 lb Date of Birth:  Dec 13, 1955    BSA:          2.211 m Patient Age:    80 years     BP:           110/82 mmHg Patient Gender: F            HR:           78 bpm. Exam Location:  Inpatient Procedure: 2D Echo, Color Doppler and Cardiac Doppler                             MODIFIED REPORT: This report was modified by Olga Millers MD on 10/08/2022 due to Change.  Indications:     I50.40* Unspecified combined systolic (congestive) and                  diastolic (congestive) heart failure  History:         Patient has no prior history of Echocardiogram examinations.                  CHF, Signs/Symptoms:Dyspnea and Shortness of Breath; Risk  Factors:Dyslipidemia and Hypertension.  Sonographer:     Sheralyn Boatman RDCS Referring Phys:  [id number] MIR Judie Petit [facility] Diagnosing Phys: Olga Millers MD  Sonographer Comments: Patient is obese. IMPRESSIONS  1. Severe hypokinesis of the entire apex with overall severe LV dysfunction; pattern suggestive of stress induced cardiomyopathy.  2. Left ventricular ejection fraction, by estimation, is 25 to 30%. The left ventricle has severely decreased function. The left ventricle demonstrates regional wall motion abnormalities (see scoring diagram/findings for description). The left ventricular internal cavity size was moderately dilated. Left ventricular diastolic parameters are consistent with Grade II diastolic dysfunction (pseudonormalization).  3. Right ventricular systolic function is normal. The right ventricular size is normal.  4. Left atrial size was mildly dilated.  5. A small pericardial effusion is present. The pericardial effusion is circumferential. There is no evidence of cardiac tamponade.  6. The mitral valve is grossly normal. Mild mitral valve regurgitation. No evidence of mitral stenosis.  7. The aortic valve is  tricuspid. Aortic valve regurgitation is not visualized. No aortic stenosis is present.  8. The inferior vena cava is dilated in size with <50% respiratory variability, suggesting right atrial pressure of 15 mmHg. Comparison(s): No prior Echocardiogram. FINDINGS  Left Ventricle: Left ventricular ejection fraction, by estimation, is 25 to 30%. The left ventricle has severely decreased function. The left ventricle demonstrates regional wall motion abnormalities. The left ventricular internal cavity size was moderately dilated. There is no left ventricular hypertrophy. Left ventricular diastolic parameters are consistent with Grade II diastolic dysfunction (pseudonormalization). Right Ventricle: The right ventricular size is normal. Right ventricular systolic function is normal. Left Atrium: Left atrial size was mildly dilated. Right Atrium: Right atrial size was normal in size. Pericardium: A small pericardial effusion is present. The pericardial effusion is circumferential. There is no evidence of cardiac tamponade. Mitral Valve: The mitral valve is grossly normal. Mild mitral valve regurgitation. No evidence of mitral valve stenosis. Tricuspid Valve: The tricuspid valve is normal in structure. Tricuspid valve regurgitation is trivial. No evidence of tricuspid stenosis. Aortic Valve: The aortic valve is tricuspid. Aortic valve regurgitation is not visualized. No aortic stenosis is present. Pulmonic Valve: The pulmonic valve was normal in structure. Pulmonic valve regurgitation is not visualized. No evidence of pulmonic stenosis. Aorta: The aortic root and ascending aorta are structurally normal, with no evidence of dilitation. Venous: The inferior vena cava is dilated in size with less than 50% respiratory variability, suggesting right atrial pressure of 15 mmHg. IAS/Shunts: No atrial level shunt detected by color flow Doppler. Additional Comments: Severe hypokinesis of the entire apex with overall severe LV  dysfunction; pattern suggestive of stress induced cardiomyopathy.  LEFT VENTRICLE PLAX 2D LVIDd:         5.20 cm      Diastology LVIDs:         4.00 cm      LV e' medial:    7.18 cm/s LV PW:         1.00 cm      LV E/e' medial:  13.1 LV IVS:        0.90 cm      LV e' lateral:   9.46 cm/s LVOT diam:     2.00 cm      LV E/e' lateral: 9.9 LV SV:         66 LV SV Index:   30 LVOT Area:     3.14 cm  LV Volumes (MOD) LV vol d, MOD A2C: 114.0 ml LV vol  d, MOD A4C: 105.0 ml LV vol s, MOD A2C: 80.0 ml LV vol s, MOD A4C: 63.4 ml LV SV MOD A2C:     34.0 ml LV SV MOD A4C:     105.0 ml LV SV MOD BP:      41.9 ml RIGHT VENTRICLE            IVC RV S prime:     9.90 cm/s  IVC diam: 2.50 cm TAPSE (M-mode): 1.4 cm LEFT ATRIUM             Index        RIGHT ATRIUM           Index LA diam:        3.80 cm 1.72 cm/m   RA Area:     11.60 cm LA Vol (A2C):   74.6 ml 33.73 ml/m  RA Volume:   26.90 ml  12.16 ml/m LA Vol (A4C):   38.3 ml 17.32 ml/m LA Biplane Vol: 56.1 ml 25.37 ml/m  AORTIC VALVE LVOT Vmax:   102.00 cm/s LVOT Vmean:  64.900 cm/s LVOT VTI:    0.209 m  AORTA Ao Root diam: 3.60 cm Ao Asc diam:  3.20 cm MITRAL VALVE MV Area (PHT): 3.72 cm    SHUNTS MV Decel Time: 204 msec    Systemic VTI:  0.21 m MV E velocity: 94.00 cm/s  Systemic Diam: 2.00 cm MV A velocity: 45.30 cm/s MV E/A ratio:  2.08 Olga Millers MD Electronically signed by Olga Millers MD Signature Date/Time: 10/08/2022/2:50:25 PM    Final (Updated)    CT Angio Chest PE W/Cm &/Or Wo Cm  Result Date: 10/08/2022 CLINICAL DATA:  67 year old female history of shortness of breath and unproductive cough. EXAM: CT ANGIOGRAPHY CHEST WITH CONTRAST TECHNIQUE: Multidetector CT imaging of the chest was performed using the standard protocol during bolus administration of intravenous contrast. Multiplanar CT image reconstructions and MIPs were obtained to evaluate the vascular anatomy. RADIATION DOSE REDUCTION: This exam was performed according to the departmental  dose-optimization program which includes automated exposure control, adjustment of the mA and/or kV according to patient size and/or use of iterative reconstruction technique. CONTRAST:  75mL OMNIPAQUE IOHEXOL 350 MG/ML SOLN COMPARISON:  Chest CTA 11/24/2004. FINDINGS: Cardiovascular: There are no filling defects within the pulmonary arterial tree to suggest pulmonary embolism. Heart size is normal. There is no significant pericardial fluid, thickening or pericardial calcification. Aortic atherosclerosis. No definite coronary artery calcifications. Mediastinum/Nodes: No pathologically enlarged mediastinal or hilar lymph nodes. Esophagus is unremarkable in appearance. No axillary lymphadenopathy. Lungs/Pleura: Diffuse bronchial wall thickening and mild thickening of the peribronchovascular interstitium. No acute consolidative airspace disease. No pleural effusions. No suspicious appearing pulmonary nodules or masses are noted. Upper Abdomen: 3 mm nonobstructive calculus in the upper pole collecting system of the right kidney. Musculoskeletal: There are no aggressive appearing lytic or blastic lesions noted in the visualized portions of the skeleton. Review of the MIP images confirms the above findings. IMPRESSION: 1. No evidence of pulmonary embolism. 2. Diffuse bronchial wall thickening and mild thickening of the peribronchovascular interstitium, concerning for probable acute bronchitis. 3. Aortic atherosclerosis. 4. 3 mm nonobstructive calculus in the upper pole collecting system of the right kidney incidentally noted. Aortic Atherosclerosis (ICD10-I70.0). Electronically Signed   By: Trudie Reed M.D.   On: 10/08/2022 05:38   DG Chest Port 1 View  Result Date: 10/08/2022 CLINICAL DATA:  Shortness of breath EXAM: PORTABLE CHEST 1 VIEW COMPARISON:  09/16/2022 FINDINGS: The heart  size and mediastinal contours are within normal limits. Both lungs are clear. The visualized skeletal structures are unremarkable.  IMPRESSION: No active disease. Electronically Signed   By: Alcide Clever M.D.   On: 10/08/2022 03:34     Medical Consultants:   None.   Subjective:    Samantha Bryant she relates her breathing is better.  Objective:    Vitals:   10/08/22 1602 10/08/22 2009 10/08/22 2245 10/09/22 0351  BP: 133/84 94/60 92/65  111/78  Pulse: 67 73  75  Resp: (!) 22 18  18   Temp: 97.6 F (36.4 C) 98.4 F (36.9 C)  97.6 F (36.4 C)  TempSrc: Oral Oral  Oral  SpO2: 100% 98%  97%  Weight:      Height:       SpO2: 97 %   Intake/Output Summary (Last 24 hours) at 10/09/2022 0738 Last data filed at 10/09/2022 0600 Gross per 24 hour  Intake 240 ml  Output 2950 ml  Net -2710 ml   Filed Weights   10/08/22 0317  Weight: 117.9 kg    Exam: General exam: In no acute distress. Respiratory system: Good air movement and clear to auscultation. Cardiovascular system: S1 & S2 heard, RRR. No JVD. Gastrointestinal system: Abdomen is nondistended, soft and nontender.  Extremities: No lower extremity edema. Skin: No rashes, lesions or ulcers Psychiatry: Judgement and insight appear normal. Mood & affect appropriate.    Data Reviewed:    Labs: Basic Metabolic Panel: Recent Labs  Lab 10/08/22 0401 10/09/22 0437  NA 143 140  K 3.7 2.7*  CL 108 104  CO2 25 27  GLUCOSE 110* 99  BUN 8 6*  CREATININE 0.70 0.84  CALCIUM 8.7* 8.4*   GFR Estimated Creatinine Clearance: 83.5 mL/min (by C-G formula based on SCr of 0.84 mg/dL). Liver Function Tests: Recent Labs  Lab 10/08/22 0401  AST 20  ALT 18  ALKPHOS 57  BILITOT 0.8  PROT 6.6  ALBUMIN 3.9   No results for input(s): "LIPASE", "AMYLASE" in the last 168 hours. No results for input(s): "AMMONIA" in the last 168 hours. Coagulation profile No results for input(s): "INR", "PROTIME" in the last 168 hours. COVID-19 Labs  No results for input(s): "DDIMER", "FERRITIN", "LDH", "CRP" in the last 72 hours.  Lab Results  Component Value Date    SARSCOV2NAA NEGATIVE 10/08/2022    CBC: Recent Labs  Lab 10/08/22 0401 10/09/22 0437  WBC 6.6 5.0  NEUTROABS 3.1  --   HGB 10.3* 9.8*  HCT 32.1* 30.8*  MCV 93.0 93.9  PLT 214 203   Cardiac Enzymes: No results for input(s): "CKTOTAL", "CKMB", "CKMBINDEX", "TROPONINI" in the last 168 hours. BNP (last 3 results) No results for input(s): "PROBNP" in the last 8760 hours. CBG: No results for input(s): "GLUCAP" in the last 168 hours. D-Dimer: No results for input(s): "DDIMER" in the last 72 hours. Hgb A1c: No results for input(s): "HGBA1C" in the last 72 hours. Lipid Profile: Recent Labs    10/08/22 0504  CHOL 255*  HDL 55  LDLCALC 183*  TRIG 87  CHOLHDL 4.6   Thyroid function studies: No results for input(s): "TSH", "T4TOTAL", "T3FREE", "THYROIDAB" in the last 72 hours.  Invalid input(s): "FREET3" Anemia work up: No results for input(s): "VITAMINB12", "FOLATE", "FERRITIN", "TIBC", "IRON", "RETICCTPCT" in the last 72 hours. Sepsis Labs: Recent Labs  Lab 10/08/22 0401 10/09/22 0437  WBC 6.6 5.0   Microbiology Recent Results (from the past 240 hour(s))  Resp panel by RT-PCR (RSV,  Flu A&B, Covid) Anterior Nasal Swab     Status: None   Collection Time: 10/08/22  3:21 AM   Specimen: Anterior Nasal Swab  Result Value Ref Range Status   SARS Coronavirus 2 by RT PCR NEGATIVE NEGATIVE Final    Comment: (NOTE) SARS-CoV-2 target nucleic acids are NOT DETECTED.  The SARS-CoV-2 RNA is generally detectable in upper respiratory specimens during the acute phase of infection. The lowest concentration of SARS-CoV-2 viral copies this assay can detect is 138 copies/mL. A negative result does not preclude SARS-Cov-2 infection and should not be used as the sole basis for treatment or other patient management decisions. A negative result may occur with  improper specimen collection/handling, submission of specimen other than nasopharyngeal swab, presence of viral mutation(s) within  the areas targeted by this assay, and inadequate number of viral copies(<138 copies/mL). A negative result must be combined with clinical observations, patient history, and epidemiological information. The expected result is Negative.  Fact Sheet for Patients:  BloggerCourse.com  Fact Sheet for Healthcare Providers:  SeriousBroker.it  This test is no t yet approved or cleared by the [place] FDA and  has been authorized for detection and/or diagnosis of SARS-CoV-2 by FDA under an Emergency Use Authorization (EUA). This EUA will remain  in effect (meaning this test can be used) for the duration of the COVID-19 declaration under Section 564(b)(1) of the Act, 21 U.S.C.section 360bbb-3(b)(1), unless the authorization is terminated  or revoked sooner.       Influenza A by PCR NEGATIVE NEGATIVE Final   Influenza B by PCR NEGATIVE NEGATIVE Final    Comment: (NOTE) The Xpert Xpress SARS-CoV-2/FLU/RSV plus assay is intended as an aid in the diagnosis of influenza from Nasopharyngeal swab specimens and should not be used as a sole basis for treatment. Nasal washings and aspirates are unacceptable for Xpert Xpress SARS-CoV-2/FLU/RSV testing.  Fact Sheet for Patients: BloggerCourse.com  Fact Sheet for Healthcare Providers: SeriousBroker.it  This test is not yet approved or cleared by the [place] FDA and has been authorized for detection and/or diagnosis of SARS-CoV-2 by FDA under an Emergency Use Authorization (EUA). This EUA will remain in effect (meaning this test can be used) for the duration of the COVID-19 declaration under Section 564(b)(1) of the Act, 21 U.S.C. section 360bbb-3(b)(1), unless the authorization is terminated or revoked.     Resp Syncytial Virus by PCR NEGATIVE NEGATIVE Final    Comment: (NOTE) Fact Sheet for  Patients: BloggerCourse.com  Fact Sheet for Healthcare Providers: SeriousBroker.it  This test is not yet approved or cleared by the [place] FDA and has been authorized for detection and/or diagnosis of SARS-CoV-2 by FDA under an Emergency Use Authorization (EUA). This EUA will remain in effect (meaning this test can be used) for the duration of the COVID-19 declaration under Section 564(b)(1) of the Act, 21 U.S.C. section 360bbb-3(b)(1), unless the authorization is terminated or revoked.  Performed at [facility], [address] [place], [place] [address]      Medications:    carvedilol  6.25 mg Oral BID WC   enoxaparin (LOVENOX) injection  60 mg Subcutaneous Q24H   furosemide  20 mg Intravenous BID   rosuvastatin  40 mg Oral QHS   Continuous Infusions:    LOS: 1 day   Marinda Elk  [facility]  10/09/2022, 7:38 AM

## 2022-10-09 NOTE — Progress Notes (Signed)
Heart Failure Nurse Navigator Progress Note  PCP: Irven Coe, MD PCP-Cardiologist: None Admission Diagnosis: Acute congestive heart failure Admitted from: Home  Presentation:   Samantha Bryant presented with shortness of breath, BLE edema + 1, unproductive cough and headache for 2 weeks,following a COVID infection a few weeks ago.  BNP 904, BMI 45.78, EKG without acute ischemic changes,   Patient was educated via phone at [facility] on the sign and symptoms of heart failure, daily weights, when ot call her doctor or go to the ED, Diet/ fluid restrictions, reports to drinking pepsi, education on taking all medication as prescribed and attending all medical appointments, patient verbalized her understanding, a HF TOC appointment was scheduled for 10/24/2022 @ 11 am.   ECHO/ LVEF: 25-30%  Clinical Course:  Past Medical History:  Diagnosis Date   Hypertension      Social History   Socioeconomic History   Marital status: Single    Spouse name: Not on file   Number of children: 2   Years of education: Not on file   Highest education level: Some college, no degree  Occupational History   Occupation: no   Occupation: [employer]  Tobacco Use   Smoking status: Former    Types: Cigarettes   Smokeless tobacco: Never  Vaping Use   Vaping status: Never Used  Substance and Sexual Activity   Alcohol use: No   Drug use: No   Sexual activity: Not on file  Other Topics Concern   Not on file  Social History Narrative   Not on file   Social Determinants of Health   Financial Resource Strain: High Risk (10/09/2022)   Overall Financial Resource Strain (CARDIA)    Difficulty of Paying Living Expenses: Hard  Food Insecurity: No Food Insecurity (10/08/2022)   Hunger Vital Sign    Worried About Running Out of Food in the Last Year: Never true    Ran Out of Food in the Last Year: Never true  Transportation Needs: No Transportation Needs (10/08/2022)   PRAPARE - Scientist, research (physical sciences) (Medical): No    Lack of Transportation (Non-Medical): No  Physical Activity: Not on file  Stress: Not on file  Social Connections: Unknown (06/05/2021)   Received from [facility]   Social Network    Social Network: Not on file   Education Assessment and Provision:  Detailed education and instructions provided on heart failure disease management including the following:  Signs and symptoms of Heart Failure When to call the physician Importance of daily weights Low sodium diet Fluid restriction Medication management Anticipated future follow-up appointments  Patient education given on each of the above topics.  Patient acknowledges understanding via teach back method and acceptance of all instructions.  Education Materials:  "Living Better With Heart Failure" Booklet, HF zone tool, & Daily Weight Tracker Tool.  Patient has scale at home: yes Patient has pill box at home: NA    High Risk Criteria for Readmission and/or Poor Patient Outcomes: Heart failure hospital admissions (last 6 months): 1  No Show rate: 6% Difficult social situation: NO Demonstrates medication adherence: yes Primary Language: English Literacy level: Reading, writing, and comprehension  Barriers of Care:   Diet/ fluid restrictions ( pepsi)  Daily weights No Insurance ( med costs)   Considerations/Referrals:   Referral made to Heart Failure Pharmacist Stewardship: No, from [facility] Referral made to Heart Failure CSW/NCM TOC: Yes, medical Insurance Referral made to Heart & Vascular TOC  clinic: Yes, 10/24/2022 @ 11 am  Items for Follow-up on DC/TOC: No medical Insurance ( med costs) Diet/ fluid restrictions ( pepsi)  Daily weights   Rhae Hammock, BSN, RN Heart Failure Teacher, adult education Only

## 2022-10-09 NOTE — Plan of Care (Signed)
  Problem: Education: Goal: Knowledge of General Education information will improve Description: Including pain rating scale, medication(s)/side effects and non-pharmacologic comfort measures Outcome: Progressing   Problem: Clinical Measurements: Goal: Ability to maintain clinical measurements within normal limits will improve Outcome: Progressing   Problem: Nutrition: Goal: Adequate nutrition will be maintained Outcome: Progressing   Problem: Health Behavior/Discharge Planning: Goal: Ability to manage health-related needs will improve Outcome: Not Progressing

## 2022-10-10 ENCOUNTER — Encounter (HOSPITAL_COMMUNITY)
Admission: EM | Disposition: A | Discharge status: Home / Self Care | Payer: Self-pay | Source: Home / Self Care | Attending: Student

## 2022-10-10 DIAGNOSIS — R7989 Other specified abnormal findings of blood chemistry: Secondary | ICD-10-CM

## 2022-10-10 HISTORY — PX: RIGHT/LEFT HEART CATH AND CORONARY ANGIOGRAPHY: CATH118266

## 2022-10-10 LAB — POCT I-STAT 7, (LYTES, BLD GAS, ICA,H+H)
Acid-Base Excess: 1 mmol/L (ref 0.0–2.0)
Bicarbonate: 26.1 mmol/L (ref 20.0–28.0)
Calcium, Ion: 1.2 mmol/L (ref 1.15–1.40)
HCT: 36 % (ref 36.0–46.0)
Hemoglobin: 12.2 g/dL (ref 12.0–15.0)
O2 Saturation: 95 %
Potassium: 4.9 mmol/L (ref 3.5–5.1)
Sodium: 140 mmol/L (ref 135–145)
TCO2: 27 mmol/L (ref 22–32)
pCO2 arterial: 40.6 mmHg (ref 32–48)
pH, Arterial: 7.415 (ref 7.35–7.45)
pO2, Arterial: 74 mmHg — ABNORMAL LOW (ref 83–108)

## 2022-10-10 LAB — BASIC METABOLIC PANEL WITH GFR
Anion gap: 11 (ref 5–15)
BUN: 8 mg/dL (ref 8–23)
CO2: 28 mmol/L (ref 22–32)
Calcium: 8.9 mg/dL (ref 8.9–10.3)
Chloride: 102 mmol/L (ref 98–111)
Creatinine, Ser: 1.04 mg/dL — ABNORMAL HIGH (ref 0.44–1.00)
GFR, Estimated: 59 mL/min — ABNORMAL LOW (ref >60)
Glucose, Bld: 94 mg/dL (ref 70–99)
Potassium: 3.8 mmol/L (ref 3.5–5.1)
Sodium: 141 mmol/L (ref 135–145)

## 2022-10-10 LAB — POCT I-STAT EG7
Acid-Base Excess: 3 mmol/L — ABNORMAL HIGH (ref 0.0–2.0)
Bicarbonate: 28 mmol/L (ref 20.0–28.0)
Calcium, Ion: 1.21 mmol/L (ref 1.15–1.40)
HCT: 36 % (ref 36.0–46.0)
Hemoglobin: 12.2 g/dL (ref 12.0–15.0)
O2 Saturation: 63 %
Potassium: 4.9 mmol/L (ref 3.5–5.1)
Sodium: 140 mmol/L (ref 135–145)
TCO2: 29 mmol/L (ref 22–32)
pCO2, Ven: 46.2 mmHg (ref 44–60)
pH, Ven: 7.391 (ref 7.25–7.43)
pO2, Ven: 33 mmHg (ref 32–45)

## 2022-10-10 LAB — CBC
HCT: 33.1 % — ABNORMAL LOW (ref 36.0–46.0)
Hemoglobin: 10.4 g/dL — ABNORMAL LOW (ref 12.0–15.0)
MCH: 29.1 pg (ref 26.0–34.0)
MCHC: 31.4 g/dL (ref 30.0–36.0)
MCV: 92.7 fL (ref 80.0–100.0)
Platelets: 229 10*3/uL (ref 150–400)
RBC: 3.57 MIL/uL — ABNORMAL LOW (ref 3.87–5.11)
RDW: 14.8 % (ref 11.5–15.5)
WBC: 4.1 10*3/uL (ref 4.0–10.5)
nRBC: 0 % (ref 0.0–0.2)

## 2022-10-10 LAB — MAGNESIUM: Magnesium: 2 mg/dL (ref 1.7–2.4)

## 2022-10-10 SURGERY — RIGHT/LEFT HEART CATH AND CORONARY ANGIOGRAPHY
Anesthesia: LOCAL

## 2022-10-10 MED ORDER — FUROSEMIDE 20 MG PO TABS
20.0000 mg | ORAL_TABLET | Freq: Every day | ORAL | Status: DC
  Administered 2022-10-11 – 2022-10-12 (×2): 20 mg via ORAL
  Filled 2022-10-10 (×2): qty 1

## 2022-10-10 MED ORDER — HEPARIN SODIUM (PORCINE) 1000 UNIT/ML IJ SOLN
INTRAMUSCULAR | Status: AC
  Filled 2022-10-10: qty 10

## 2022-10-10 MED ORDER — SODIUM CHLORIDE 0.9% FLUSH: 3.0000 mL | INTRAVENOUS | Status: DC | PRN

## 2022-10-10 MED ORDER — MIDAZOLAM HCL 2 MG/2ML IJ SOLN
INTRAMUSCULAR | Status: DC | PRN
  Administered 2022-10-10: 1 mg via INTRAVENOUS

## 2022-10-10 MED ORDER — LIDOCAINE HCL (PF) 1 % IJ SOLN
INTRAMUSCULAR | Status: AC
  Filled 2022-10-10: qty 30

## 2022-10-10 MED ORDER — POTASSIUM CHLORIDE CRYS ER 20 MEQ PO TBCR
40.0000 meq | EXTENDED_RELEASE_TABLET | Freq: Once | ORAL | Status: AC
  Administered 2022-10-10: 40 meq via ORAL
  Filled 2022-10-10: qty 2

## 2022-10-10 MED ORDER — HEPARIN SODIUM (PORCINE) 1000 UNIT/ML IJ SOLN
INTRAMUSCULAR | Status: DC | PRN
  Administered 2022-10-10: 5000 [IU] via INTRAVENOUS

## 2022-10-10 MED ORDER — VERAPAMIL HCL 2.5 MG/ML IV SOLN
INTRAVENOUS | Status: DC | PRN
  Administered 2022-10-10: 10 mL via INTRA_ARTERIAL

## 2022-10-10 MED ORDER — POTASSIUM CHLORIDE CRYS ER 20 MEQ PO TBCR: 40.0000 meq | EXTENDED_RELEASE_TABLET | Freq: Once | ORAL | Status: DC

## 2022-10-10 MED ORDER — MIDAZOLAM HCL 2 MG/2ML IJ SOLN
INTRAMUSCULAR | Status: AC
  Filled 2022-10-10: qty 2

## 2022-10-10 MED ORDER — VERAPAMIL HCL 2.5 MG/ML IV SOLN
INTRAVENOUS | Status: AC
  Filled 2022-10-10: qty 2

## 2022-10-10 MED ORDER — SODIUM CHLORIDE 0.9% FLUSH
3.0000 mL | Freq: Two times a day (BID) | INTRAVENOUS | Status: DC
  Administered 2022-10-10 – 2022-10-13 (×6): 3 mL via INTRAVENOUS

## 2022-10-10 MED ORDER — LIDOCAINE HCL (PF) 1 % IJ SOLN
INTRAMUSCULAR | Status: DC | PRN
  Administered 2022-10-10: 1 mL
  Administered 2022-10-10: 2 mL

## 2022-10-10 MED ORDER — IOHEXOL 350 MG/ML SOLN
INTRAVENOUS | Status: DC | PRN
  Administered 2022-10-10: 60 mL

## 2022-10-10 MED ORDER — SODIUM CHLORIDE 0.9 % IV SOLN: 250.0000 mL | INTRAVENOUS | Status: DC | PRN

## 2022-10-10 MED ORDER — HEPARIN (PORCINE) IN NACL 1000-0.9 UT/500ML-% IV SOLN
INTRAVENOUS | Status: DC | PRN
  Administered 2022-10-10 (×2): 500 mL

## 2022-10-10 SURGICAL SUPPLY — 11 items
CATH BALLN WEDGE 5F 110CM (CATHETERS) IMPLANT
CATH INFINITI 5FR JK (CATHETERS) IMPLANT
CATH INFINITI 6F FL3.5 (CATHETERS) IMPLANT
DEVICE RAD COMP TR BAND LRG (VASCULAR PRODUCTS) IMPLANT
GLIDESHEATH SLEND SS 6F .021 (SHEATH) IMPLANT
GUIDEWIRE INQWIRE 1.5J.035X260 (WIRE) IMPLANT
INQWIRE 1.5J .035X260CM (WIRE) ×1 IMPLANT
KIT HEART LEFT (KITS) IMPLANT
PACK CARDIAC CATHETERIZATION (CUSTOM PROCEDURE TRAY) ×1 IMPLANT
SHEATH GLIDE SLENDER 4/5FR (SHEATH) IMPLANT
TRANSDUCER W/STOPCOCK (MISCELLANEOUS) IMPLANT

## 2022-10-10 NOTE — Plan of Care (Signed)
  Problem: Education: Goal: Knowledge of General Education information will improve Description: Including pain rating scale, medication(s)/side effects and non-pharmacologic comfort measures Outcome: Progressing   Problem: Clinical Measurements: Goal: Ability to maintain clinical measurements within normal limits will improve Outcome: Progressing Goal: Will remain free from infection Outcome: Progressing Goal: Diagnostic test results will improve Outcome: Progressing Goal: Respiratory complications will improve Outcome: Progressing   Problem: Coping: Goal: Level of anxiety will decrease Outcome: Progressing   Problem: Elimination: Goal: Will not experience complications related to bowel motility Outcome: Progressing

## 2022-10-10 NOTE — Progress Notes (Signed)
TR BAND REMOVAL  LOCATION:    radial  rt radial arterial site  DEFLATED PER PROTOCOL:   yes  TIME BAND OFF / DRESSING APPLIED:    1855/gauze and tegaderm  SITE UPON ARRIVAL:    Level 0  SITE AFTER BAND REMOVAL:    Level 0  CIRCULATION SENSATION AND MOVEMENT:    Within Normal Limits : rt radial pulse 2+ palpable, rt hand and fingers warm and pink, sensation present, good pleth waveform  COMMENTS:   instructions reviewed w/patient

## 2022-10-10 NOTE — Interval H&P Note (Signed)
History and Physical Interval Note:  10/10/2022 4:42 PM  Samantha Bryant  has presented today for surgery, with the diagnosis of heart failure.  The various methods of treatment have been discussed with the patient and family. After consideration of risks, benefits and other options for treatment, the patient has consented to  Procedure(s): RIGHT/LEFT HEART CATH AND CORONARY ANGIOGRAPHY (N/A) as a surgical intervention.  The patient's history has been reviewed, patient examined, no change in status, stable for surgery.  I have reviewed the patient's chart and labs.  Questions were answered to the patient's satisfaction.     Lorine Bears

## 2022-10-10 NOTE — Progress Notes (Signed)
1330 - Received pt from [facility] via [facility] to [facility]. VSS and no reports of pain at this time. Per EMS, received ASA at [facility] this morning.  Consent signed. Bilateral groin sites and radial sites shaved. All questions answered at this time.

## 2022-10-10 NOTE — Plan of Care (Signed)
Problem: Education: Goal: Knowledge of General Education information will improve Description: Including pain rating scale, medication(s)/side effects and non-pharmacologic comfort measures Outcome: Progressing   Problem: Clinical Measurements: Goal: Diagnostic test results will improve Outcome: Progressing Goal: Respiratory complications will improve Outcome: Progressing   Problem: Safety: Goal: Ability to remain free from injury will improve Outcome: Progressing

## 2022-10-10 NOTE — Progress Notes (Signed)
[facility] PROGRESS NOTE    Progress Note  Samantha Bryant  TGG:[id number] DOB: 02-Mar-1955 DOA: 10/08/2022 PCP: Irven Coe, MD     Brief Narrative:   Samantha Bryant is an 67 y.o. female past medical history significant for obesity, essential hypertension admitted to heart for heart failure and hypoxic with ambulation, CTA showed evidence of acute bronchitis, hemoglobin of 10 BNP of 900.   Assessment/Plan:   Acute systolic heart failure: Continue Coreg and IV Lasix, Jardiance and Aldactone was added.  She is negative about 3.5 L. 2D echo showed an EF of 25 to 30%.  Potassium is low will replete orally recheck tomorrow morning try to optimize magnesium and potassium. Continue strict I's and O's and daily weights. Cardiology was consulted which agree with management and recommended cardiac cath scheduled for 10/11/2022.  Elevated troponins: Cardiac biomarkers are basically flat.  Hypokalemia: Is low this morning replete orally recheck.  Dyslipidemia, goal LDL below 70 Continue statins.  Normocytic anemia: Will need follow-up with PCP as an outpatient.    DVT prophylaxis: lovenox Family Communication:none Status is: Inpatient Remains inpatient appropriate because: Acute systolic heart failure    Code Status:     Code Status Orders  (From admission, onward)           Start     Ordered   10/08/22 0714  Full code  Continuous       Question:  By:  Answer:  Consent: discussion documented in EHR   10/08/22 0714           Code Status History     This patient has a current code status but no historical code status.         IV Access:   Peripheral IV   Procedures and diagnostic studies:   ECHOCARDIOGRAM COMPLETE  Result Date: 10/08/2022    ECHOCARDIOGRAM REPORT   Patient Name:   Samantha Bryant Date of Exam: 10/08/2022 Medical Rec #:  [id number]    Height:       65.0 in Accession #:    [id number]   Weight:       260.0 lb Date of Birth:  28-Aug-1955     BSA:          2.211 m Patient Age:    25 years     BP:           110/82 mmHg Patient Gender: F            HR:           78 bpm. Exam Location:  Inpatient Procedure: 2D Echo, Color Doppler and Cardiac Doppler                             MODIFIED REPORT: This report was modified by Olga Millers MD on 10/08/2022 due to Change.  Indications:     I50.40* Unspecified combined systolic (congestive) and                  diastolic (congestive) heart failure  History:         Patient has no prior history of Echocardiogram examinations.                  CHF, Signs/Symptoms:Dyspnea and Shortness of Breath; Risk                  Factors:Dyslipidemia and Hypertension.  Sonographer:     Sheralyn Boatman RDCS Referring  Phys:  [id number] MIR Judie Petit [facility] Diagnosing Phys: Olga Millers MD  Sonographer Comments: Patient is obese. IMPRESSIONS  1. Severe hypokinesis of the entire apex with overall severe LV dysfunction; pattern suggestive of stress induced cardiomyopathy.  2. Left ventricular ejection fraction, by estimation, is 25 to 30%. The left ventricle has severely decreased function. The left ventricle demonstrates regional wall motion abnormalities (see scoring diagram/findings for description). The left ventricular internal cavity size was moderately dilated. Left ventricular diastolic parameters are consistent with Grade II diastolic dysfunction (pseudonormalization).  3. Right ventricular systolic function is normal. The right ventricular size is normal.  4. Left atrial size was mildly dilated.  5. A small pericardial effusion is present. The pericardial effusion is circumferential. There is no evidence of cardiac tamponade.  6. The mitral valve is grossly normal. Mild mitral valve regurgitation. No evidence of mitral stenosis.  7. The aortic valve is tricuspid. Aortic valve regurgitation is not visualized. No aortic stenosis is present.  8. The inferior vena cava is dilated in size with <50% respiratory variability, suggesting  right atrial pressure of 15 mmHg. Comparison(s): No prior Echocardiogram. FINDINGS  Left Ventricle: Left ventricular ejection fraction, by estimation, is 25 to 30%. The left ventricle has severely decreased function. The left ventricle demonstrates regional wall motion abnormalities. The left ventricular internal cavity size was moderately dilated. There is no left ventricular hypertrophy. Left ventricular diastolic parameters are consistent with Grade II diastolic dysfunction (pseudonormalization). Right Ventricle: The right ventricular size is normal. Right ventricular systolic function is normal. Left Atrium: Left atrial size was mildly dilated. Right Atrium: Right atrial size was normal in size. Pericardium: A small pericardial effusion is present. The pericardial effusion is circumferential. There is no evidence of cardiac tamponade. Mitral Valve: The mitral valve is grossly normal. Mild mitral valve regurgitation. No evidence of mitral valve stenosis. Tricuspid Valve: The tricuspid valve is normal in structure. Tricuspid valve regurgitation is trivial. No evidence of tricuspid stenosis. Aortic Valve: The aortic valve is tricuspid. Aortic valve regurgitation is not visualized. No aortic stenosis is present. Pulmonic Valve: The pulmonic valve was normal in structure. Pulmonic valve regurgitation is not visualized. No evidence of pulmonic stenosis. Aorta: The aortic root and ascending aorta are structurally normal, with no evidence of dilitation. Venous: The inferior vena cava is dilated in size with less than 50% respiratory variability, suggesting right atrial pressure of 15 mmHg. IAS/Shunts: No atrial level shunt detected by color flow Doppler. Additional Comments: Severe hypokinesis of the entire apex with overall severe LV dysfunction; pattern suggestive of stress induced cardiomyopathy.  LEFT VENTRICLE PLAX 2D LVIDd:         5.20 cm      Diastology LVIDs:         4.00 cm      LV e' medial:    7.18 cm/s LV  PW:         1.00 cm      LV E/e' medial:  13.1 LV IVS:        0.90 cm      LV e' lateral:   9.46 cm/s LVOT diam:     2.00 cm      LV E/e' lateral: 9.9 LV SV:         66 LV SV Index:   30 LVOT Area:     3.14 cm  LV Volumes (MOD) LV vol d, MOD A2C: 114.0 ml LV vol d, MOD A4C: 105.0 ml LV vol s, MOD A2C: 80.0 ml LV  vol s, MOD A4C: 63.4 ml LV SV MOD A2C:     34.0 ml LV SV MOD A4C:     105.0 ml LV SV MOD BP:      41.9 ml RIGHT VENTRICLE            IVC RV S prime:     9.90 cm/s  IVC diam: 2.50 cm TAPSE (M-mode): 1.4 cm LEFT ATRIUM             Index        RIGHT ATRIUM           Index LA diam:        3.80 cm 1.72 cm/m   RA Area:     11.60 cm LA Vol (A2C):   74.6 ml 33.73 ml/m  RA Volume:   26.90 ml  12.16 ml/m LA Vol (A4C):   38.3 ml 17.32 ml/m LA Biplane Vol: 56.1 ml 25.37 ml/m  AORTIC VALVE LVOT Vmax:   102.00 cm/s LVOT Vmean:  64.900 cm/s LVOT VTI:    0.209 m  AORTA Ao Root diam: 3.60 cm Ao Asc diam:  3.20 cm MITRAL VALVE MV Area (PHT): 3.72 cm    SHUNTS MV Decel Time: 204 msec    Systemic VTI:  0.21 m MV E velocity: 94.00 cm/s  Systemic Diam: 2.00 cm MV A velocity: 45.30 cm/s MV E/A ratio:  2.08 Olga Millers MD Electronically signed by Olga Millers MD Signature Date/Time: 10/08/2022/2:50:25 PM    Final (Updated)      Medical Consultants:   None.   Subjective:    SEHAM WYSS relates her breathing is back to baseline.  Objective:    Vitals:   10/09/22 1222 10/09/22 1409 10/09/22 2143 10/10/22 0518  BP: 97/70  97/62 101/60  Pulse: 71  63 62  Resp: 18  16   Temp: 97.7 F (36.5 C)  97.8 F (36.6 C) 98.2 F (36.8 C)  TempSrc: Oral  Oral Oral  SpO2: 99%  100% 100%  Weight:  124.8 kg    Height:       SpO2: 100 %   Intake/Output Summary (Last 24 hours) at 10/10/2022 0951 Last data filed at 10/10/2022 0518 Gross per 24 hour  Intake 480 ml  Output 900 ml  Net -420 ml   Filed Weights   10/08/22 0317 10/09/22 1409  Weight: 117.9 kg 124.8 kg    Exam: General exam: In no acute  distress. Respiratory system: Good air movement and clear to auscultation. Cardiovascular system: S1 & S2 heard, RRR. No JVD. Gastrointestinal system: Abdomen is nondistended, soft and nontender.  Extremities: No pedal edema. Skin: No rashes, lesions or ulcers Psychiatry: Judgement and insight appear normal. Mood & affect appropriate.  Data Reviewed:    Labs: Basic Metabolic Panel: Recent Labs  Lab 10/08/22 0401 10/09/22 0437  NA 143 140  K 3.7 2.7*  CL 108 104  CO2 25 27  GLUCOSE 110* 99  BUN 8 6*  CREATININE 0.70 0.84  CALCIUM 8.7* 8.4*   GFR Estimated Creatinine Clearance: 86.3 mL/min (by C-G formula based on SCr of 0.84 mg/dL). Liver Function Tests: Recent Labs  Lab 10/08/22 0401  AST 20  ALT 18  ALKPHOS 57  BILITOT 0.8  PROT 6.6  ALBUMIN 3.9   No results for input(s): "LIPASE", "AMYLASE" in the last 168 hours. No results for input(s): "AMMONIA" in the last 168 hours. Coagulation profile No results for input(s): "INR", "PROTIME" in the last 168 hours. COVID-19  Labs  No results for input(s): "DDIMER", "FERRITIN", "LDH", "CRP" in the last 72 hours.  Lab Results  Component Value Date   SARSCOV2NAA NEGATIVE 10/08/2022    CBC: Recent Labs  Lab 10/08/22 0401 10/09/22 0437  WBC 6.6 5.0  NEUTROABS 3.1  --   HGB 10.3* 9.8*  HCT 32.1* 30.8*  MCV 93.0 93.9  PLT 214 203   Cardiac Enzymes: No results for input(s): "CKTOTAL", "CKMB", "CKMBINDEX", "TROPONINI" in the last 168 hours. BNP (last 3 results) No results for input(s): "PROBNP" in the last 8760 hours. CBG: No results for input(s): "GLUCAP" in the last 168 hours. D-Dimer: No results for input(s): "DDIMER" in the last 72 hours. Hgb A1c: No results for input(s): "HGBA1C" in the last 72 hours. Lipid Profile: Recent Labs    10/08/22 0504  CHOL 255*  HDL 55  LDLCALC 183*  TRIG 87  CHOLHDL 4.6   Thyroid function studies: No results for input(s): "TSH", "T4TOTAL", "T3FREE", "THYROIDAB" in the  last 72 hours.  Invalid input(s): "FREET3" Anemia work up: No results for input(s): "VITAMINB12", "FOLATE", "FERRITIN", "TIBC", "IRON", "RETICCTPCT" in the last 72 hours. Sepsis Labs: Recent Labs  Lab 10/08/22 0401 10/09/22 0437  WBC 6.6 5.0   Microbiology Recent Results (from the past 240 hour(s))  Resp panel by RT-PCR (RSV, Flu A&B, Covid) Anterior Nasal Swab     Status: None   Collection Time: 10/08/22  3:21 AM   Specimen: Anterior Nasal Swab  Result Value Ref Range Status   SARS Coronavirus 2 by RT PCR NEGATIVE NEGATIVE Final    Comment: (NOTE) SARS-CoV-2 target nucleic acids are NOT DETECTED.  The SARS-CoV-2 RNA is generally detectable in upper respiratory specimens during the acute phase of infection. The lowest concentration of SARS-CoV-2 viral copies this assay can detect is 138 copies/mL. A negative result does not preclude SARS-Cov-2 infection and should not be used as the sole basis for treatment or other patient management decisions. A negative result may occur with  improper specimen collection/handling, submission of specimen other than nasopharyngeal swab, presence of viral mutation(s) within the areas targeted by this assay, and inadequate number of viral copies(<138 copies/mL). A negative result must be combined with clinical observations, patient history, and epidemiological information. The expected result is Negative.  Fact Sheet for Patients:  BloggerCourse.com  Fact Sheet for Healthcare Providers:  SeriousBroker.it  This test is no t yet approved or cleared by the [place] FDA and  has been authorized for detection and/or diagnosis of SARS-CoV-2 by FDA under an Emergency Use Authorization (EUA). This EUA will remain  in effect (meaning this test can be used) for the duration of the COVID-19 declaration under Section 564(b)(1) of the Act, 21 U.S.C.section 360bbb-3(b)(1), unless the authorization  is terminated  or revoked sooner.       Influenza A by PCR NEGATIVE NEGATIVE Final   Influenza B by PCR NEGATIVE NEGATIVE Final    Comment: (NOTE) The Xpert Xpress SARS-CoV-2/FLU/RSV plus assay is intended as an aid in the diagnosis of influenza from Nasopharyngeal swab specimens and should not be used as a sole basis for treatment. Nasal washings and aspirates are unacceptable for Xpert Xpress SARS-CoV-2/FLU/RSV testing.  Fact Sheet for Patients: BloggerCourse.com  Fact Sheet for Healthcare Providers: SeriousBroker.it  This test is not yet approved or cleared by the [place] FDA and has been authorized for detection and/or diagnosis of SARS-CoV-2 by FDA under an Emergency Use Authorization (EUA). This EUA will remain in effect (meaning this test  can be used) for the duration of the COVID-19 declaration under Section 564(b)(1) of the Act, 21 U.S.C. section 360bbb-3(b)(1), unless the authorization is terminated or revoked.     Resp Syncytial Virus by PCR NEGATIVE NEGATIVE Final    Comment: (NOTE) Fact Sheet for Patients: BloggerCourse.com  Fact Sheet for Healthcare Providers: SeriousBroker.it  This test is not yet approved or cleared by the [place] FDA and has been authorized for detection and/or diagnosis of SARS-CoV-2 by FDA under an Emergency Use Authorization (EUA). This EUA will remain in effect (meaning this test can be used) for the duration of the COVID-19 declaration under Section 564(b)(1) of the Act, 21 U.S.C. section 360bbb-3(b)(1), unless the authorization is terminated or revoked.  Performed at [facility], [address], [place], [place] [address]      Medications:    carvedilol  6.25 mg Oral BID WC   empagliflozin  10 mg Oral Daily   enoxaparin (LOVENOX) injection  60 mg Subcutaneous Q24H   furosemide  20 mg  Intravenous Daily   potassium chloride  40 mEq Oral Once   rosuvastatin  40 mg Oral QHS   spironolactone  25 mg Oral Daily   Continuous Infusions:  sodium chloride        LOS: 2 days   Marinda Elk  [facility]  10/10/2022, 9:51 AM

## 2022-10-11 ENCOUNTER — Encounter (HOSPITAL_COMMUNITY): Payer: Self-pay | Admitting: Cardiovascular Disease

## 2022-10-11 DIAGNOSIS — I5041 Acute combined systolic (congestive) and diastolic (congestive) heart failure: Secondary | ICD-10-CM

## 2022-10-11 LAB — RENAL FUNCTION PANEL
Albumin: 3.8 g/dL (ref 3.5–5.0)
Anion gap: 10 (ref 5–15)
BUN: 8 mg/dL (ref 8–23)
CO2: 28 mmol/L (ref 22–32)
Calcium: 9.4 mg/dL (ref 8.9–10.3)
Chloride: 101 mmol/L (ref 98–111)
Creatinine, Ser: 0.81 mg/dL (ref 0.44–1.00)
GFR, Estimated: >60 mL/min (ref >60)
Glucose, Bld: 115 mg/dL — ABNORMAL HIGH (ref 70–99)
Phosphorus: 3 mg/dL (ref 2.5–4.6)
Potassium: 4.5 mmol/L (ref 3.5–5.1)
Sodium: 139 mmol/L (ref 135–145)

## 2022-10-11 LAB — MAGNESIUM: Magnesium: 2.1 mg/dL (ref 1.7–2.4)

## 2022-10-11 LAB — CBC
HCT: 36.6 % (ref 36.0–46.0)
Hemoglobin: 11.7 g/dL — ABNORMAL LOW (ref 12.0–15.0)
MCH: 29.4 pg (ref 26.0–34.0)
MCHC: 32 g/dL (ref 30.0–36.0)
MCV: 92 fL (ref 80.0–100.0)
Platelets: 232 10*3/uL (ref 150–400)
RBC: 3.98 MIL/uL (ref 3.87–5.11)
RDW: 14.6 % (ref 11.5–15.5)
WBC: 4.6 10*3/uL (ref 4.0–10.5)
nRBC: 0 % (ref 0.0–0.2)

## 2022-10-11 LAB — HEMOGLOBIN A1C
Hgb A1c MFr Bld: 5.5 % (ref 4.8–5.6)
Mean Plasma Glucose: 111.15 mg/dL

## 2022-10-11 MED ORDER — ORAL CARE MOUTH RINSE: 15.0000 mL | OROMUCOSAL | Status: DC | PRN

## 2022-10-11 NOTE — Plan of Care (Signed)
  Problem: Pain Managment: Goal: General experience of comfort will improve Outcome: Progressing   Problem: Education: Goal: Ability to demonstrate management of disease process will improve Outcome: Progressing   Problem: Cardiac: Goal: Ability to achieve and maintain adequate cardiopulmonary perfusion will improve Outcome: Progressing   Problem: Education: Goal: Understanding of CV disease, CV risk reduction, and recovery process will improve Outcome: Progressing   Problem: Cardiovascular: Goal: Ability to achieve and maintain adequate cardiovascular perfusion will improve Outcome: Progressing

## 2022-10-11 NOTE — Progress Notes (Signed)
PROGRESS NOTE  Samantha Bryant UEA:[id number] DOB: 11/09/1955   PCP: Irven Coe, MD  Patient is from: Home.  Lives alone.  DOA: 10/08/2022 LOS: 3  Chief complaints Chief Complaint  Patient presents with   Shortness of Breath     Brief Narrative / Interim history: 67 year old F with PMH of obesity, HLD and HTN presenting with cough and DOE, and admitted to heart for heart failure and hypoxic to 85% with ambulation on room air.  Recently diagnosed with COVID-19 2 weeks prior.  BNP elevated to 900.  Troponin 110s.  CTA showed evidence of acute bronchitis, hemoglobin of 10 BNP of 900.  Patient was started on IV diuretics.  Cardiology consulted.  TTE with LVEF of 25 to 30%, RWMA, G2 DD and normal RVSP.  LHC negative for CAD but moderate to severe LVSD.Marland Kitchen  Cardiology started GDMT  Subjective: Seen and examined earlier this morning.  No major events overnight of this morning.  No complaints other than some soreness at right radial access site and peripheral IV site.  Reports dyspnea on exertion.  Feels tired.  Objective: Vitals:   10/11/22 0027 10/11/22 0428 10/11/22 0818 10/11/22 1215  BP: (!) 91/56 114/64 (!) 85/52 104/72  Pulse: 67 66 75 69  Resp: 18 19 16  (!) 23  Temp: 98 F (36.7 C) 98 F (36.7 C) 97.8 F (36.6 C) 98.9 F (37.2 C)  TempSrc: Oral Oral Oral Oral  SpO2: 93% 99% 100% 95%  Weight:      Height:        Examination:  GENERAL: No apparent distress.  Nontoxic. HEENT: MMM.  Vision and hearing grossly intact.  NECK: Supple.  No apparent JVD.  RESP:  No IWOB.  Fair aeration bilaterally. CVS:  RRR. Heart sounds normal.  ABD/GI/GU: BS+. Abd soft, NTND.  MSK/EXT:  Moves extremities. No apparent deformity.  Trace BLE edema. SKIN: no apparent skin lesion or wound NEURO: Awake, alert and oriented appropriately.  No apparent focal neuro deficit. PSYCH: Calm. Normal affect.   Procedures:  9/18-LHC negative but moderate to severe LVSD.  Microbiology summarized: COVID-19,  influenza and RSV PCR nonreactive  Assessment and plan: Principal Problem:   Acute CHF (congestive heart failure) (HCC) Active Problems:   Dyspnea on exertion   Dyslipidemia, goal LDL below 70   HTN (hypertension)   Elevated troponin  Acute combined CHF: Presents with cough and dyspnea.  Recently diagnosed with COVID-19 about 2 weeks prior.  BNP was elevated to 900.  CT angio negative for PE but diffuse bronchial wall thickening concerning for bronchitis.  TTE with LVEF of 25 to 30%, G2 DD and RWMA.  LHC negative for CAD.  Diuresed with IV Lasix.  Net -4.4 L. -Cardiology on board-adjusting GDMT and diuretics. -Strict intake and output, daily weight, renal functions and electrolytes   Elevated troponins: Likely demand ischemia from the above.  LHC negative.  LDL elevated. -Continue high intensity statin -Cardiac meds as above   Hypokalemia: -Monitor replenish as appropriate   Dyslipidemia: LDL 183.  Lipoprotein elevated as well. -Continue statins. -Check hemoglobin A1c   Normocytic anemia: Stable. Recent Labs    09/16/22 1803 10/08/22 0401 10/09/22 0437 10/10/22 0931 10/10/22 1655 10/10/22 1658 10/11/22 0839  HGB 13.1 10.3* 9.8* 10.4* 12.2 12.2 11.7*    Morbid obesity Body mass index is 45.78 kg/m. -Encourage lifestyle change to lose weight          DVT prophylaxis:  SCDs Start: 10/08/22 0714  Code Status: Full code  Family Communication: None at bedside Level of care: Telemetry Status is: Inpatient Remains inpatient appropriate because: Due to acute combined CHF   Final disposition: Home Consultants:  Cardiology  35 minutes with more than 50% spent in reviewing records, counseling patient/family and coordinating care.   Sch Meds:  Scheduled Meds:  carvedilol  6.25 mg Oral BID WC   empagliflozin  10 mg Oral Daily   enoxaparin (LOVENOX) injection  60 mg Subcutaneous Q24H   furosemide  20 mg Oral Daily   rosuvastatin  40 mg Oral QHS   sodium  chloride flush  3 mL Intravenous Q12H   spironolactone  25 mg Oral Daily   Continuous Infusions:  sodium chloride     PRN Meds:.sodium chloride, acetaminophen **OR** acetaminophen, albuterol, benzonatate, ondansetron **OR** ondansetron (ZOFRAN) IV, mouth rinse, sodium chloride flush, traZODone  Antimicrobials: Anti-infectives (From admission, onward)    None        I have personally reviewed the following labs and images: CBC: Recent Labs  Lab 10/08/22 0401 10/09/22 0437 10/10/22 0931 10/10/22 1655 10/10/22 1658 10/11/22 0839  WBC 6.6 5.0 4.1  --   --  4.6  NEUTROABS 3.1  --   --   --   --   --   HGB 10.3* 9.8* 10.4* 12.2 12.2 11.7*  HCT 32.1* 30.8* 33.1* 36.0 36.0 36.6  MCV 93.0 93.9 92.7  --   --  92.0  PLT 214 203 229  --   --  232   BMP &GFR Recent Labs  Lab 10/08/22 0401 10/09/22 0437 10/10/22 0931 10/10/22 1655 10/10/22 1658 10/11/22 0839  NA 143 140 141 140 140 139  K 3.7 2.7* 3.8 4.9 4.9 4.5  CL 108 104 102  --   --  101  CO2 25 27 28   --   --  28  GLUCOSE 110* 99 94  --   --  115*  BUN 8 6* 8  --   --  8  CREATININE 0.70 0.84 1.04*  --   --  0.81  CALCIUM 8.7* 8.4* 8.9  --   --  9.4  MG  --   --  2.0  --   --  2.1  PHOS  --   --   --   --   --  3.0   Estimated Creatinine Clearance: 89.5 mL/min (by C-G formula based on SCr of 0.81 mg/dL). Liver & Pancreas: Recent Labs  Lab 10/08/22 0401 10/11/22 0839  AST 20  --   ALT 18  --   ALKPHOS 57  --   BILITOT 0.8  --   PROT 6.6  --   ALBUMIN 3.9 3.8   No results for input(s): "LIPASE", "AMYLASE" in the last 168 hours. No results for input(s): "AMMONIA" in the last 168 hours. Diabetic: No results for input(s): "HGBA1C" in the last 72 hours. No results for input(s): "GLUCAP" in the last 168 hours. Cardiac Enzymes: No results for input(s): "CKTOTAL", "CKMB", "CKMBINDEX", "TROPONINI" in the last 168 hours. No results for input(s): "PROBNP" in the last 8760 hours. Coagulation Profile: No results  for input(s): "INR", "PROTIME" in the last 168 hours. Thyroid Function Tests: No results for input(s): "TSH", "T4TOTAL", "FREET4", "T3FREE", "THYROIDAB" in the last 72 hours. Lipid Profile: No results for input(s): "CHOL", "HDL", "LDLCALC", "TRIG", "CHOLHDL", "LDLDIRECT" in the last 72 hours. Anemia Panel: No results for input(s): "VITAMINB12", "FOLATE", "FERRITIN", "TIBC", "IRON", "RETICCTPCT" in the last 72 hours. Urine analysis: No results found for: "COLORURINE", "  APPEARANCEUR", "LABSPEC", "PHURINE", "GLUCOSEU", "HGBUR", "BILIRUBINUR", "KETONESUR", "PROTEINUR", "UROBILINOGEN", "NITRITE", "LEUKOCYTESUR" Sepsis Labs: Invalid input(s): "PROCALCITONIN", "LACTICIDVEN"  Microbiology: Recent Results (from the past 240 hour(s))  Resp panel by RT-PCR (RSV, Flu A&B, Covid) Anterior Nasal Swab     Status: None   Collection Time: 10/08/22  3:21 AM   Specimen: Anterior Nasal Swab  Result Value Ref Range Status   SARS Coronavirus 2 by RT PCR NEGATIVE NEGATIVE Final    Comment: (NOTE) SARS-CoV-2 target nucleic acids are NOT DETECTED.  The SARS-CoV-2 RNA is generally detectable in upper respiratory specimens during the acute phase of infection. The lowest concentration of SARS-CoV-2 viral copies this assay can detect is 138 copies/mL. A negative result does not preclude SARS-Cov-2 infection and should not be used as the sole basis for treatment or other patient management decisions. A negative result may occur with  improper specimen collection/handling, submission of specimen other than nasopharyngeal swab, presence of viral mutation(s) within the areas targeted by this assay, and inadequate number of viral copies(<138 copies/mL). A negative result must be combined with clinical observations, patient history, and epidemiological information. The expected result is Negative.  Fact Sheet for Patients:  BloggerCourse.com  Fact Sheet for Healthcare Providers:   SeriousBroker.it  This test is no t yet approved or cleared by the [place] FDA and  has been authorized for detection and/or diagnosis of SARS-CoV-2 by FDA under an Emergency Use Authorization (EUA). This EUA will remain  in effect (meaning this test can be used) for the duration of the COVID-19 declaration under Section 564(b)(1) of the Act, 21 U.S.C.section 360bbb-3(b)(1), unless the authorization is terminated  or revoked sooner.       Influenza A by PCR NEGATIVE NEGATIVE Final   Influenza B by PCR NEGATIVE NEGATIVE Final    Comment: (NOTE) The Xpert Xpress SARS-CoV-2/FLU/RSV plus assay is intended as an aid in the diagnosis of influenza from Nasopharyngeal swab specimens and should not be used as a sole basis for treatment. Nasal washings and aspirates are unacceptable for Xpert Xpress SARS-CoV-2/FLU/RSV testing.  Fact Sheet for Patients: BloggerCourse.com  Fact Sheet for Healthcare Providers: SeriousBroker.it  This test is not yet approved or cleared by the [place] FDA and has been authorized for detection and/or diagnosis of SARS-CoV-2 by FDA under an Emergency Use Authorization (EUA). This EUA will remain in effect (meaning this test can be used) for the duration of the COVID-19 declaration under Section 564(b)(1) of the Act, 21 U.S.C. section 360bbb-3(b)(1), unless the authorization is terminated or revoked.     Resp Syncytial Virus by PCR NEGATIVE NEGATIVE Final    Comment: (NOTE) Fact Sheet for Patients: BloggerCourse.com  Fact Sheet for Healthcare Providers: SeriousBroker.it  This test is not yet approved or cleared by the [place] FDA and has been authorized for detection and/or diagnosis of SARS-CoV-2 by FDA under an Emergency Use Authorization (EUA). This EUA will remain in effect (meaning this test can be used) for  the duration of the COVID-19 declaration under Section 564(b)(1) of the Act, 21 U.S.C. section 360bbb-3(b)(1), unless the authorization is terminated or revoked.  Performed at [facility], [address], [place], [place] [address]     Radiology Studies: CARDIAC CATHETERIZATION  Result Date: 10/10/2022   There is moderate to severe left ventricular systolic dysfunction.   LV end diastolic pressure is mildly elevated.   The left ventricular ejection fraction is 25-35% by visual estimate. 1.  Minimal irregularities with no evidence of obstructive coronary  artery disease. 2.  Moderately to severely reduced LV systolic function with wall motion abnormalities suggestive of stress-induced cardiomyopathy. 3.  Right heart catheterization showed high normal right and left-sided filling pressures, mild pulmonary hypertension and normal cardiac output. Recommendations: Recommend medical therapy for stress-induced cardiomyopathy. I switched IV furosemide to oral 20 mg once daily.      Krystian Ferrentino T. [facility]  If 7PM-7AM, please contact night-coverage [email or web address] 10/11/2022, 3:07 PM

## 2022-10-11 NOTE — TOC CM/SW Note (Signed)
TOC Brief Assessment   Insurance and Status Insurance coverage has been reviewed  Patient has primary care physician Yes  Home environment has been reviewed Yes  Prior level of function: Independent  Prior/Current Home Services No current home services  Social Determinants of Health Reivew SDOH reviewed no interventions necessary  Readmission risk has been reviewed Yes  Transition of care needs no transition of care needs at this time

## 2022-10-11 NOTE — Plan of Care (Signed)
  Problem: Education: Goal: Knowledge of General Education information will improve Description: Including pain rating scale, medication(s)/side effects and non-pharmacologic comfort measures Outcome: Progressing   Problem: Clinical Measurements: Goal: Respiratory complications will improve Outcome: Progressing Goal: Cardiovascular complication will be avoided Outcome: Progressing   Problem: Activity: Goal: Risk for activity intolerance will decrease Outcome: Progressing   Problem: Nutrition: Goal: Adequate nutrition will be maintained Outcome: Progressing   Problem: Elimination: Goal: Will not experience complications related to bowel motility Outcome: Progressing

## 2022-10-11 NOTE — Progress Notes (Signed)
DAILY PROGRESS NOTE   Patient Name: Samantha Bryant Date of Encounter: 10/11/2022 Cardiologist: None  Chief Complaint   No complaints  Patient Profile   67 yo female with obesity, hypertension, dyslipidemia, presented with 2 week history of DOE, cough, following a COVID infections a few weeks ago. We are asked to see for congestive heart failure.   Subjective   Underwent R/LHC yesterday, noting moderate to severe LV dysfunction. No significant CAD, RHC showed high normal right and left heart filling pressures. Suspect stress-induced (Takatsubo) cardiomyopathy. Diuresed another 740 cc overnight. Creatinine normal today. She feels well.  Objective   Vitals:   10/11/22 0027 10/11/22 0428 10/11/22 0818 10/11/22 1215  BP: (!) 91/56 114/64 (!) 85/52 104/72  Pulse: 67 66 75 69  Resp: 18 19 16  (!) 23  Temp: 98 F (36.7 C) 98 F (36.7 C) 97.8 F (36.6 C) 98.9 F (37.2 C)  TempSrc: Oral Oral Oral Oral  SpO2: 93% 99% 100% 95%  Weight:      Height:        Intake/Output Summary (Last 24 hours) at 10/11/2022 1300 Last data filed at 10/11/2022 0843 Gross per 24 hour  Intake 378.83 ml  Output 1000 ml  Net -621.17 ml   Filed Weights   10/08/22 0317 10/09/22 1409  Weight: 117.9 kg 124.8 kg    Physical Exam   General appearance: alert and morbidly obese Neck: JVD - 2 cm above sternal notch, no carotid bruit, and thyroid not enlarged, symmetric, no tenderness/mass/nodules Lungs: clear to auscultation bilaterally Heart: regular rate and rhythm Abdomen: soft, non-tender; bowel sounds normal; no masses,  no organomegaly Extremities: edema 1+  Pulses: 2+ and symmetric Skin: Skin color, texture, turgor normal. No rashes or lesions Neurologic: Grossly normal Psych: Pleasant  Inpatient Medications    Scheduled Meds:  carvedilol  6.25 mg Oral BID WC   empagliflozin  10 mg Oral Daily   enoxaparin (LOVENOX) injection  60 mg Subcutaneous Q24H   furosemide  20 mg Oral Daily    rosuvastatin  40 mg Oral QHS   sodium chloride flush  3 mL Intravenous Q12H   spironolactone  25 mg Oral Daily    Continuous Infusions:  sodium chloride      PRN Meds: sodium chloride, acetaminophen **OR** acetaminophen, albuterol, benzonatate, ondansetron **OR** ondansetron (ZOFRAN) IV, mouth rinse, sodium chloride flush, traZODone   Labs   Results for orders placed or performed during the hospital encounter of 10/08/22 (from the past 48 hour(s))  Basic metabolic panel     Status: Abnormal   Collection Time: 10/10/22  9:31 AM  Result Value Ref Range   Sodium 141 135 - 145 mmol/L   Potassium 3.8 3.5 - 5.1 mmol/L   Chloride 102 98 - 111 mmol/L   CO2 28 22 - 32 mmol/L   Glucose, Bld 94 70 - 99 mg/dL    Comment: Glucose reference range applies only to samples taken after fasting for at least 8 hours.   BUN 8 8 - 23 mg/dL   Creatinine, Ser 0.62 (H) 0.44 - 1.00 mg/dL   Calcium 8.9 8.9 - 37.6 mg/dL   GFR, Estimated 59 (L) >60 mL/min    Comment: (NOTE) Calculated using the CKD-EPI Creatinine Equation (2021)    Anion gap 11 5 - 15    Comment: Performed at [facility], [address] [place], [place] [address]  CBC     Status: Abnormal   Collection Time: 10/10/22  9:31  AM  Result Value Ref Range   WBC 4.1 4.0 - 10.5 K/uL   RBC 3.57 (L) 3.87 - 5.11 MIL/uL   Hemoglobin 10.4 (L) 12.0 - 15.0 g/dL   HCT 16.1 (L) 09.6 - 04.5 %   MCV 92.7 80.0 - 100.0 fL   MCH 29.1 26.0 - 34.0 pg   MCHC 31.4 30.0 - 36.0 g/dL   RDW 40.9 81.1 - 91.4 %   Platelets 229 150 - 400 K/uL   nRBC 0.0 0.0 - 0.2 %    Comment: Performed at [facility], [address] [place], [place] [address]  Magnesium     Status: None   Collection Time: 10/10/22  9:31 AM  Result Value Ref Range   Magnesium 2.0 1.7 - 2.4 mg/dL    Comment: Performed at [facility], [address], [place], [place] [address]  POCT I-Stat EG7     Status: Abnormal   Collection Time:  10/10/22  4:55 PM  Result Value Ref Range   pH, Ven 7.391 7.25 - 7.43   pCO2, Ven 46.2 44 - 60 mmHg   pO2, Ven 33 32 - 45 mmHg   Bicarbonate 28.0 20.0 - 28.0 mmol/L   TCO2 29 22 - 32 mmol/L   O2 Saturation 63 %   Acid-Base Excess 3.0 (H) 0.0 - 2.0 mmol/L   Sodium 140 135 - 145 mmol/L   Potassium 4.9 3.5 - 5.1 mmol/L   Calcium, Ion 1.21 1.15 - 1.40 mmol/L   HCT 36.0 36.0 - 46.0 %   Hemoglobin 12.2 12.0 - 15.0 g/dL   Sample type VENOUS   I-STAT 7, (LYTES, BLD GAS, ICA, H+H)     Status: Abnormal   Collection Time: 10/10/22  4:58 PM  Result Value Ref Range   pH, Arterial 7.415 7.35 - 7.45   pCO2 arterial 40.6 32 - 48 mmHg   pO2, Arterial 74 (L) 83 - 108 mmHg   Bicarbonate 26.1 20.0 - 28.0 mmol/L   TCO2 27 22 - 32 mmol/L   O2 Saturation 95 %   Acid-Base Excess 1.0 0.0 - 2.0 mmol/L   Sodium 140 135 - 145 mmol/L   Potassium 4.9 3.5 - 5.1 mmol/L   Calcium, Ion 1.20 1.15 - 1.40 mmol/L   HCT 36.0 36.0 - 46.0 %   Hemoglobin 12.2 12.0 - 15.0 g/dL   Sample type ARTERIAL   Renal function panel     Status: Abnormal   Collection Time: 10/11/22  8:39 AM  Result Value Ref Range   Sodium 139 135 - 145 mmol/L   Potassium 4.5 3.5 - 5.1 mmol/L   Chloride 101 98 - 111 mmol/L   CO2 28 22 - 32 mmol/L   Glucose, Bld 115 (H) 70 - 99 mg/dL    Comment: Glucose reference range applies only to samples taken after fasting for at least 8 hours.   BUN 8 8 - 23 mg/dL   Creatinine, Ser 8.65 0.44 - 1.00 mg/dL   Calcium 9.4 8.9 - 78.4 mg/dL   Phosphorus 3.0 2.5 - 4.6 mg/dL   Albumin 3.8 3.5 - 5.0 g/dL   GFR, Estimated >69 >62 mL/min    Comment: (NOTE) Calculated using the CKD-EPI Creatinine Equation (2021)    Anion gap 10 5 - 15    Comment: Performed at [facility], [address] [place], [place] [address]  Magnesium     Status: None   Collection Time: 10/11/22  8:39 AM  Result Value Ref Range   Magnesium 2.1 1.7 - 2.4 mg/dL    Comment: Performed at [facility], [address] [place], [place] [address]  CBC     Status: Abnormal   Collection Time: 10/11/22  8:39 AM  Result Value Ref Range   WBC 4.6 4.0 - 10.5 K/uL   RBC 3.98 3.87 - 5.11 MIL/uL   Hemoglobin 11.7 (L) 12.0 - 15.0 g/dL   HCT 93.2 35.5 - 73.2 %   MCV 92.0 80.0 - 100.0 fL   MCH 29.4 26.0 - 34.0 pg   MCHC 32.0 30.0 - 36.0 g/dL   RDW 20.2 54.2 - 70.6 %   Platelets 232 150 - 400 K/uL   nRBC 0.0 0.0 - 0.2 %    Comment: Performed at [facility], [address] [place], [place] [address]    ECG   N/A  Telemetry   Sinus rhythm - Personally Reviewed  Radiology    CARDIAC CATHETERIZATION  Result Date: 10/10/2022   There is moderate to severe left ventricular systolic dysfunction.   LV end diastolic pressure is mildly elevated.   The left ventricular ejection fraction is 25-35% by visual estimate. 1.  Minimal irregularities with no evidence of obstructive coronary artery disease. 2.  Moderately to severely reduced LV systolic function with wall motion abnormalities suggestive of stress-induced cardiomyopathy. 3.  Right heart catheterization showed high normal right and left-sided filling pressures, mild pulmonary hypertension and normal cardiac output. Recommendations: Recommend medical therapy for stress-induced cardiomyopathy. I switched IV furosemide to oral 20 mg once daily.    Cardiac Studies   See echo above  Assessment   Principal Problem:   Acute CHF (congestive heart failure) (HCC) Active Problems:   Dyspnea on exertion   Dyslipidemia, goal LDL below 70   HTN (hypertension)   Elevated troponin   Plan   Acute systolic congestive heart failure (LVEF 25-30%, NYHA Class IV symptoms) Presentation of acute dyspnea on exertion following COVID positive bronchitis.  She subsequently has tested negative for presented and was hypoxemic with an elevated BNP consistent with congestive heart failure.   Good response to lasix - was naaive. Switch  to oral lasix. Replete potassium to >4.0, check magnesium  Possible viral cardiomyopathy, however, strong family history of premature CAD in brother and significantly elevated cholesterol - possibly elevated LP(a) - pending. Minimal coronary calcifications. Would need to exclude CAD - would recommend [facility] when clinically compensated. Add jardiance 10 mg daily Add spironolactone 25 mg daily Switched to oral lasix 20 mg daily. Dyslipidemia TC 255, TG 87, HDL 55 and LDL 183 -noted to have aortic atherosclerosis on imaging.   Continue high intensity rosuvastatin 40 mg at bedtime LP(a) elevated at 105.3 nmol/L Hypertension Blood pressure reasonably well-controlled to mildly elevated.   Continue coreg 6.25 mg BID  Transitioned to oral lasix 20 mg daily. Check BNP tomorrow - close to d/c, probably tomorrow.    Time Spent Directly with Patient:  I have spent a total of 25 minutes with the patient reviewing hospital notes, telemetry, EKGs, labs and examining the patient as well as establishing an assessment and plan that was discussed personally with the patient.  > 50% of time was spent in direct patient care.  Length of Stay:  LOS: 3 days   Chrystie Nose, MD, [facility], FACP  [place]  [facility]  Medical Director of the [facility] Lipid [facility]  Card[facility]ovascular Risk Reduction Clinic Diplomate of the  American Board of Clinical Lipidology Attending Cardiologist  Direct Dial: [phone]  Fax: [phone]  Website:  [email or web address] Nicely Elya Diloreto 10/11/2022, 1:00 PM

## 2022-10-12 DIAGNOSIS — I5031 Acute diastolic (congestive) heart failure: Secondary | ICD-10-CM

## 2022-10-12 LAB — RENAL FUNCTION PANEL
Albumin: 3.9 g/dL (ref 3.5–5.0)
Anion gap: 10 (ref 5–15)
BUN: 14 mg/dL (ref 8–23)
CO2: 27 mmol/L (ref 22–32)
Calcium: 9.1 mg/dL (ref 8.9–10.3)
Chloride: 96 mmol/L — ABNORMAL LOW (ref 98–111)
Creatinine, Ser: 1.2 mg/dL — ABNORMAL HIGH (ref 0.44–1.00)
GFR, Estimated: 50 mL/min — ABNORMAL LOW (ref >60)
Glucose, Bld: 99 mg/dL (ref 70–99)
Phosphorus: 4 mg/dL (ref 2.5–4.6)
Potassium: 3.9 mmol/L (ref 3.5–5.1)
Sodium: 133 mmol/L — ABNORMAL LOW (ref 135–145)

## 2022-10-12 LAB — CBC
HCT: 37.7 % (ref 36.0–46.0)
Hemoglobin: 12.1 g/dL (ref 12.0–15.0)
MCH: 29.4 pg (ref 26.0–34.0)
MCHC: 32.1 g/dL (ref 30.0–36.0)
MCV: 91.7 fL (ref 80.0–100.0)
Platelets: 232 10*3/uL (ref 150–400)
RBC: 4.11 MIL/uL (ref 3.87–5.11)
RDW: 14.3 % (ref 11.5–15.5)
WBC: 5.4 10*3/uL (ref 4.0–10.5)
nRBC: 0 % (ref 0.0–0.2)

## 2022-10-12 LAB — BRAIN NATRIURETIC PEPTIDE: B Natriuretic Peptide: 336.3 pg/mL — ABNORMAL HIGH (ref 0.0–100.0)

## 2022-10-12 LAB — MAGNESIUM: Magnesium: 2.2 mg/dL (ref 1.7–2.4)

## 2022-10-12 MED ORDER — OXYCODONE HCL 5 MG PO TABS
5.0000 mg | ORAL_TABLET | Freq: Three times a day (TID) | ORAL | Status: DC | PRN
  Administered 2022-10-12: 5 mg via ORAL
  Filled 2022-10-12: qty 1

## 2022-10-12 MED ORDER — FUROSEMIDE 40 MG PO TABS
40.0000 mg | ORAL_TABLET | Freq: Every day | ORAL | Status: DC
  Administered 2022-10-13: 40 mg via ORAL
  Filled 2022-10-12: qty 1

## 2022-10-12 NOTE — Plan of Care (Signed)
  Problem: Clinical Measurements: Goal: Cardiovascular complication will be avoided Outcome: Progressing   Problem: Nutrition: Goal: Adequate nutrition will be maintained Outcome: Progressing   Problem: Coping: Goal: Level of anxiety will decrease Outcome: Progressing   Problem: Activity: Goal: Capacity to carry out activities will improve Outcome: Progressing

## 2022-10-12 NOTE — Progress Notes (Signed)
DAILY PROGRESS NOTE   Patient Name: Samantha Bryant Date of Encounter: 10/12/2022 Cardiologist: None  Chief Complaint   No complaints  Patient Profile   67 yo female with obesity, hypertension, dyslipidemia, presented with 2 week history of DOE, cough, following a COVID infections a few weeks ago. We are asked to see for congestive heart failure.   Subjective   About net even - overall 4L negative. Transitioned to oral lasix today. BNP still elevated, but improved at 336 (was 904).    Objective   Vitals:   10/11/22 0818 10/11/22 1215 10/11/22 2023 10/12/22 0432  BP: (!) 85/52 104/72 97/60 99/63   Pulse: 75 69 73 64  Resp: 16 (!) 23 20 20   Temp: 97.8 F (36.6 C) 98.9 F (37.2 C) 98 F (36.7 C) 97.9 F (36.6 C)  TempSrc: Oral Oral Oral Oral  SpO2: 100% 95% 95% 97%  Weight:      Height:        Intake/Output Summary (Last 24 hours) at 10/12/2022 1125 Last data filed at 10/12/2022 1000 Gross per 24 hour  Intake 480 ml  Output 300 ml  Net 180 ml   Filed Weights   10/08/22 0317 10/09/22 1409  Weight: 117.9 kg 124.8 kg    Physical Exam   General appearance: alert and morbidly obese Neck: JVD - 2 cm above sternal notch, no carotid bruit, and thyroid not enlarged, symmetric, no tenderness/mass/nodules Lungs: clear to auscultation bilaterally Heart: regular rate and rhythm Abdomen: soft, non-tender; bowel sounds normal; no masses,  no organomegaly Extremities: edema 1+  Pulses: 2+ and symmetric Skin: Skin color, texture, turgor normal. No rashes or lesions Neurologic: Grossly normal Psych: Pleasant  Inpatient Medications    Scheduled Meds:  carvedilol  6.25 mg Oral BID WC   empagliflozin  10 mg Oral Daily   enoxaparin (LOVENOX) injection  60 mg Subcutaneous Q24H   furosemide  20 mg Oral Daily   rosuvastatin  40 mg Oral QHS   sodium chloride flush  3 mL Intravenous Q12H   spironolactone  25 mg Oral Daily    Continuous Infusions:  sodium chloride       PRN Meds: sodium chloride, acetaminophen **OR** acetaminophen, albuterol, benzonatate, ondansetron **OR** ondansetron (ZOFRAN) IV, mouth rinse, sodium chloride flush, traZODone   Labs   Results for orders placed or performed during the hospital encounter of 10/08/22 (from the past 48 hour(s))  POCT I-Stat EG7     Status: Abnormal   Collection Time: 10/10/22  4:55 PM  Result Value Ref Range   pH, Ven 7.391 7.25 - 7.43   pCO2, Ven 46.2 44 - 60 mmHg   pO2, Ven 33 32 - 45 mmHg   Bicarbonate 28.0 20.0 - 28.0 mmol/L   TCO2 29 22 - 32 mmol/L   O2 Saturation 63 %   Acid-Base Excess 3.0 (H) 0.0 - 2.0 mmol/L   Sodium 140 135 - 145 mmol/L   Potassium 4.9 3.5 - 5.1 mmol/L   Calcium, Ion 1.21 1.15 - 1.40 mmol/L   HCT 36.0 36.0 - 46.0 %   Hemoglobin 12.2 12.0 - 15.0 g/dL   Sample type VENOUS   I-STAT 7, (LYTES, BLD GAS, ICA, H+H)     Status: Abnormal   Collection Time: 10/10/22  4:58 PM  Result Value Ref Range   pH, Arterial 7.415 7.35 - 7.45   pCO2 arterial 40.6 32 - 48 mmHg   pO2, Arterial 74 (L) 83 - 108 mmHg   Bicarbonate  26.1 20.0 - 28.0 mmol/L   TCO2 27 22 - 32 mmol/L   O2 Saturation 95 %   Acid-Base Excess 1.0 0.0 - 2.0 mmol/L   Sodium 140 135 - 145 mmol/L   Potassium 4.9 3.5 - 5.1 mmol/L   Calcium, Ion 1.20 1.15 - 1.40 mmol/L   HCT 36.0 36.0 - 46.0 %   Hemoglobin 12.2 12.0 - 15.0 g/dL   Sample type ARTERIAL   Renal function panel     Status: Abnormal   Collection Time: 10/11/22  8:39 AM  Result Value Ref Range   Sodium 139 135 - 145 mmol/L   Potassium 4.5 3.5 - 5.1 mmol/L   Chloride 101 98 - 111 mmol/L   CO2 28 22 - 32 mmol/L   Glucose, Bld 115 (H) 70 - 99 mg/dL    Comment: Glucose reference range applies only to samples taken after fasting for at least 8 hours.   BUN 8 8 - 23 mg/dL   Creatinine, Ser 7.84 0.44 - 1.00 mg/dL   Calcium 9.4 8.9 - 69.6 mg/dL   Phosphorus 3.0 2.5 - 4.6 mg/dL   Albumin 3.8 3.5 - 5.0 g/dL   GFR, Estimated >29 >52 mL/min    Comment:  (NOTE) Calculated using the CKD-EPI Creatinine Equation (2021)    Anion gap 10 5 - 15    Comment: Performed at [facility], [address] [place], [place] [address]  Magnesium     Status: None   Collection Time: 10/11/22  8:39 AM  Result Value Ref Range   Magnesium 2.1 1.7 - 2.4 mg/dL    Comment: Performed at [facility], [address], [place], [place] [address]  CBC     Status: Abnormal   Collection Time: 10/11/22  8:39 AM  Result Value Ref Range   WBC 4.6 4.0 - 10.5 K/uL   RBC 3.98 3.87 - 5.11 MIL/uL   Hemoglobin 11.7 (L) 12.0 - 15.0 g/dL   HCT 27.2 53.6 - 64.4 %   MCV 92.0 80.0 - 100.0 fL   MCH 29.4 26.0 - 34.0 pg   MCHC 32.0 30.0 - 36.0 g/dL   RDW 03.4 74.2 - 59.5 %   Platelets 232 150 - 400 K/uL   nRBC 0.0 0.0 - 0.2 %    Comment: Performed at [facility], [address] [place], [place] [address]  Hemoglobin A1c     Status: None   Collection Time: 10/11/22  3:33 PM  Result Value Ref Range   Hgb A1c MFr Bld 5.5 4.8 - 5.6 %    Comment: (NOTE) Pre diabetes:          5.7%-6.4%  Diabetes:              >6.4%  Glycemic control for   <7.0% adults with diabetes    Mean Plasma Glucose 111.15 mg/dL    Comment: Performed at [facility], [address], [place], [place] [address]  Brain natriuretic peptide     Status: Abnormal   Collection Time: 10/12/22  4:06 AM  Result Value Ref Range   B Natriuretic Peptide 336.3 (H) 0.0 - 100.0 pg/mL    Comment: Performed at [facility], [address] [place], [place] [address]  Renal function panel     Status: Abnormal   Collection Time: 10/12/22  4:06 AM  Result Value Ref Range   Sodium 133 (L) 135 - 145 mmol/L   Potassium 3.9 3.5 -  5.1 mmol/L   Chloride 96 (L) 98 - 111 mmol/L   CO2 27 22 - 32 mmol/L   Glucose, Bld 99 70 - 99 mg/dL    Comment: Glucose reference range applies only to samples taken after fasting for at least 8 hours.   BUN 14  8 - 23 mg/dL   Creatinine, Ser 8.46 (H) 0.44 - 1.00 mg/dL   Calcium 9.1 8.9 - 96.2 mg/dL   Phosphorus 4.0 2.5 - 4.6 mg/dL   Albumin 3.9 3.5 - 5.0 g/dL   GFR, Estimated 50 (L) >60 mL/min    Comment: (NOTE) Calculated using the CKD-EPI Creatinine Equation (2021)    Anion gap 10 5 - 15    Comment: Performed at [facility], [address], [place], [place] [address]  Magnesium     Status: None   Collection Time: 10/12/22  4:06 AM  Result Value Ref Range   Magnesium 2.2 1.7 - 2.4 mg/dL    Comment: Performed at [facility], [address] [place], [place] [address]  CBC     Status: None   Collection Time: 10/12/22  4:06 AM  Result Value Ref Range   WBC 5.4 4.0 - 10.5 K/uL   RBC 4.11 3.87 - 5.11 MIL/uL   Hemoglobin 12.1 12.0 - 15.0 g/dL   HCT 01.0 27.2 - 53.6 %   MCV 91.7 80.0 - 100.0 fL   MCH 29.4 26.0 - 34.0 pg   MCHC 32.1 30.0 - 36.0 g/dL   RDW 64.4 03.4 - 74.2 %   Platelets 232 150 - 400 K/uL   nRBC 0.0 0.0 - 0.2 %    Comment: Performed at [facility], [address], [place], [place] [address]    ECG   N/A  Telemetry   Sinus rhythm - Personally Reviewed  Radiology    CARDIAC CATHETERIZATION  Result Date: 10/10/2022   There is moderate to severe left ventricular systolic dysfunction.   LV end diastolic pressure is mildly elevated.   The left ventricular ejection fraction is 25-35% by visual estimate. 1.  Minimal irregularities with no evidence of obstructive coronary artery disease. 2.  Moderately to severely reduced LV systolic function with wall motion abnormalities suggestive of stress-induced cardiomyopathy. 3.  Right heart catheterization showed high normal right and left-sided filling pressures, mild pulmonary hypertension and normal cardiac output. Recommendations: Recommend medical therapy for stress-induced cardiomyopathy. I switched IV furosemide to oral 20 mg once daily.    Cardiac Studies   See echo  above  Assessment   Principal Problem:   Acute CHF (congestive heart failure) (HCC) Active Problems:   Dyspnea on exertion   Dyslipidemia, goal LDL below 70   HTN (hypertension)   Elevated troponin   Plan   Acute systolic congestive heart failure (LVEF 25-30%, NYHA Class IV symptoms) Presentation of acute dyspnea on exertion following COVID positive bronchitis.  She subsequently has tested negative for presented and was hypoxemic with an elevated BNP consistent with congestive heart failure.   Good response to lasix - was naaive. Switch to oral lasix. Replete potassium to >4.0, check magnesium  Possible viral cardiomyopathy, however, strong family history of premature CAD in brother and significantly elevated cholesterol - possibly elevated LP(a) - pending. Minimal coronary calcifications. Would need to exclude CAD - would recommend [facility] when clinically compensated. Add jardiance 10 mg daily Add spironolactone 25 mg daily Switched to oral lasix 20 mg daily. Still a little volume up, will increase dose  to 40 mg daily starting tomorrow. Dyslipidemia TC 255, TG 87, HDL 55 and LDL 183 -noted to have aortic atherosclerosis on imaging.   Continue high intensity rosuvastatin 40 mg at bedtime LP(a) elevated at 105.3 nmol/L Hypertension Blood pressure reasonably well-controlled to mildly elevated.   Continue coreg 6.25 mg BID  Ok to d/c from a cardiac standpoint - I think the correct dose of lasix now is 40 mg daily - she is still a little volume up, but not bad.  We will arrange for follow-up.  [facility] will sign off.   Medication Recommendations:  as above Other recommendations (labs, testing, etc):  none Follow up as an outpatient:  Dr. Rennis Golden or APP  Time Spent Directly with Patient:  I have spent a total of 25 minutes with the patient reviewing hospital notes, telemetry, EKGs, labs and examining the patient as well as establishing an assessment and plan that was  discussed personally with the patient.  > 50% of time was spent in direct patient care.  Length of Stay:  LOS: 4 days   Chrystie Nose, MD, [facility], FACP  [place]  [facility]  Medical Director of the [facility] Lipid Disorders &  Cardiovascular Risk Reduction Clinic Diplomate of the American Board of Clinical Lipidology Attending Cardiologist  Direct Dial: [phone]  Fax: [phone]  Website:  [email or web address] 10/12/2022, 11:25 AM

## 2022-10-12 NOTE — Progress Notes (Signed)
PROGRESS NOTE  Samantha Bryant ZOX:[id number] DOB: January 06, 1956   PCP: Irven Coe, MD  Patient is from: Home.  Lives alone.  DOA: 10/08/2022 LOS: 4  Chief complaints Chief Complaint  Patient presents with   Shortness of Breath     Brief Narrative / Interim history: 67 year old F with PMH of obesity, HLD and HTN presenting with cough and DOE, and admitted to heart for heart failure and hypoxic to 85% with ambulation on room air.  Recently diagnosed with COVID-19 2 weeks prior.  BNP elevated to 900.  Troponin 110s.  CTA showed evidence of acute bronchitis, hemoglobin of 10 BNP of 900.  Patient was started on IV diuretics.  Cardiology consulted.  TTE with LVEF of 25 to 30%, RWMA, G2 DD and normal RVSP.  LHC negative for CAD but moderate to severe LVSD.   Patient is cleared for discharge on diuretics and GDMT.  Likely discharge on 9/21 if renal function stable or improves.  Subjective: Seen and examined earlier this morning.  No major events overnight of this morning.  Had no complaint earlier this morning but reported significant headache that did not improve with Tylenol to RN later.  She denies chest pain, shortness of breath or orthopnea.  She had AKI and soft blood pressure.  Objective: Vitals:   10/11/22 1215 10/11/22 2023 10/12/22 0432 10/12/22 1329  BP: 104/72 97/60 99/63  (!) 88/56  Pulse: 69 73 64 65  Resp: (!) 23 20 20 17   Temp: 98.9 F (37.2 C) 98 F (36.7 C) 97.9 F (36.6 C) 98 F (36.7 C)  TempSrc: Oral Oral Oral Oral  SpO2: 95% 95% 97% 100%  Weight:      Height:        Examination:  GENERAL: No apparent distress.  Nontoxic. HEENT: MMM.  Vision and hearing grossly intact.  NECK: Supple.  No apparent JVD.  RESP:  No IWOB.  Fair aeration bilaterally. CVS:  RRR. Heart sounds normal.  ABD/GI/GU: BS+. Abd soft, NTND.  MSK/EXT:  Moves extremities. No apparent deformity.  Trace BLE edema. SKIN: no apparent skin lesion or wound NEURO: Awake, alert and oriented  appropriately.  No apparent focal neuro deficit. PSYCH: Calm. Normal affect.   Procedures:  9/18-LHC negative but moderate to severe LVSD.  Microbiology summarized: COVID-19, influenza and RSV PCR nonreactive  Assessment and plan: Principal Problem:   Acute CHF (congestive heart failure) (HCC) Active Problems:   Dyspnea on exertion   Dyslipidemia, goal LDL below 70   HTN (hypertension)   Elevated troponin  Acute combined CHF: Presents with cough and dyspnea.  Recently diagnosed with COVID-19 about 2 weeks prior.  BNP was elevated to 900.  CT angio negative for PE but diffuse bronchial wall thickening concerning for bronchitis.  TTE with LVEF of 25 to 30%, G2 DD and RWMA.  LHC negative for CAD.  Diuresed with IV Lasix.  I&O incomplete but net -4 L.  Seems to have AKI.  Soft but pressures. -Cardiology cleared for discharge on diuretics and GDMT -Strict intake and output, daily weight, renal functions and electrolytes -Likely discharge on 10/13/2022 if renal function is stable or improves   Elevated troponins: Likely demand ischemia from the above.  LHC negative.  LDL elevated. -Continue high intensity statin -Cardiac meds as above  AKI: Baseline Cr ~0.7-0.8.  Likely due to diuretics.  She has soft blood pressures at home. Recent Labs    09/16/22 1803 10/08/22 0401 10/09/22 0437 10/10/22 0931 10/11/22 0839 10/12/22 0406  BUN  12 8 6* 8 8 14   CREATININE 0.84 0.70 0.84 1.04* 0.81 1.20*  -Recheck in the morning  Hypokalemia: -Monitor replenish as appropriate   Dyslipidemia: LDL 183.  A1c 5.5%.  Lipoprotein elevated as well. -Crestor increased to 40 mg daily.   Normocytic anemia: Stable. Recent Labs    09/16/22 1803 10/08/22 0401 10/09/22 0437 10/10/22 0931 10/10/22 1655 10/10/22 1658 10/11/22 0839 10/12/22 0406  HGB 13.1 10.3* 9.8* 10.4* 12.2 12.2 11.7* 12.1  -Monitor intermittently  Morbid obesity Body mass index is 45.78 kg/m. -Encourage lifestyle change to  lose weight          DVT prophylaxis:  SCDs Start: 10/08/22 4010  Code Status: Full code Family Communication: None at bedside Level of care: Telemetry Status is: Inpatient Remains inpatient appropriate because: Due to acute combined CHF and AKI   Final disposition: Home Consultants:  Cardiology-signed off  35 minutes with more than 50% spent in reviewing records, counseling patient/family and coordinating care.   Sch Meds:  Scheduled Meds:  carvedilol  6.25 mg Oral BID WC   empagliflozin  10 mg Oral Daily   enoxaparin (LOVENOX) injection  60 mg Subcutaneous Q24H   [START ON 10/13/2022] furosemide  40 mg Oral Daily   rosuvastatin  40 mg Oral QHS   sodium chloride flush  3 mL Intravenous Q12H   spironolactone  25 mg Oral Daily   Continuous Infusions:  sodium chloride     PRN Meds:.sodium chloride, acetaminophen **OR** acetaminophen, albuterol, benzonatate, ondansetron **OR** ondansetron (ZOFRAN) IV, mouth rinse, oxyCODONE, sodium chloride flush, traZODone  Antimicrobials: Anti-infectives (From admission, onward)    None        I have personally reviewed the following labs and images: CBC: Recent Labs  Lab 10/08/22 0401 10/09/22 0437 10/10/22 0931 10/10/22 1655 10/10/22 1658 10/11/22 0839 10/12/22 0406  WBC 6.6 5.0 4.1  --   --  4.6 5.4  NEUTROABS 3.1  --   --   --   --   --   --   HGB 10.3* 9.8* 10.4* 12.2 12.2 11.7* 12.1  HCT 32.1* 30.8* 33.1* 36.0 36.0 36.6 37.7  MCV 93.0 93.9 92.7  --   --  92.0 91.7  PLT 214 203 229  --   --  232 232   BMP &GFR Recent Labs  Lab 10/08/22 0401 10/09/22 0437 10/10/22 0931 10/10/22 1655 10/10/22 1658 10/11/22 0839 10/12/22 0406  NA 143 140 141 140 140 139 133*  K 3.7 2.7* 3.8 4.9 4.9 4.5 3.9  CL 108 104 102  --   --  101 96*  CO2 25 27 28   --   --  28 27  GLUCOSE 110* 99 94  --   --  115* 99  BUN 8 6* 8  --   --  8 14  CREATININE 0.70 0.84 1.04*  --   --  0.81 1.20*  CALCIUM 8.7* 8.4* 8.9  --   --  9.4  9.1  MG  --   --  2.0  --   --  2.1 2.2  PHOS  --   --   --   --   --  3.0 4.0   Estimated Creatinine Clearance: 60.4 mL/min (A) (by C-G formula based on SCr of 1.2 mg/dL (H)). Liver & Pancreas: Recent Labs  Lab 10/08/22 0401 10/11/22 0839 10/12/22 0406  AST 20  --   --   ALT 18  --   --   ALKPHOS 57  --   --  BILITOT 0.8  --   --   PROT 6.6  --   --   ALBUMIN 3.9 3.8 3.9   No results for input(s): "LIPASE", "AMYLASE" in the last 168 hours. No results for input(s): "AMMONIA" in the last 168 hours. Diabetic: Recent Labs    10/11/22 1533  HGBA1C 5.5   No results for input(s): "GLUCAP" in the last 168 hours. Cardiac Enzymes: No results for input(s): "CKTOTAL", "CKMB", "CKMBINDEX", "TROPONINI" in the last 168 hours. No results for input(s): "PROBNP" in the last 8760 hours. Coagulation Profile: No results for input(s): "INR", "PROTIME" in the last 168 hours. Thyroid Function Tests: No results for input(s): "TSH", "T4TOTAL", "FREET4", "T3FREE", "THYROIDAB" in the last 72 hours. Lipid Profile: No results for input(s): "CHOL", "HDL", "LDLCALC", "TRIG", "CHOLHDL", "LDLDIRECT" in the last 72 hours. Anemia Panel: No results for input(s): "VITAMINB12", "FOLATE", "FERRITIN", "TIBC", "IRON", "RETICCTPCT" in the last 72 hours. Urine analysis: No results found for: "COLORURINE", "APPEARANCEUR", "LABSPEC", "PHURINE", "GLUCOSEU", "HGBUR", "BILIRUBINUR", "KETONESUR", "PROTEINUR", "UROBILINOGEN", "NITRITE", "LEUKOCYTESUR" Sepsis Labs: Invalid input(s): "PROCALCITONIN", "LACTICIDVEN"  Microbiology: Recent Results (from the past 240 hour(s))  Resp panel by RT-PCR (RSV, Flu A&B, Covid) Anterior Nasal Swab     Status: None   Collection Time: 10/08/22  3:21 AM   Specimen: Anterior Nasal Swab  Result Value Ref Range Status   SARS Coronavirus 2 by RT PCR NEGATIVE NEGATIVE Final    Comment: (NOTE) SARS-CoV-2 target nucleic acids are NOT DETECTED.  The SARS-CoV-2 RNA is generally detectable  in upper respiratory specimens during the acute phase of infection. The lowest concentration of SARS-CoV-2 viral copies this assay can detect is 138 copies/mL. A negative result does not preclude SARS-Cov-2 infection and should not be used as the sole basis for treatment or other patient management decisions. A negative result may occur with  improper specimen collection/handling, submission of specimen other than nasopharyngeal swab, presence of viral mutation(s) within the areas targeted by this assay, and inadequate number of viral copies(<138 copies/mL). A negative result must be combined with clinical observations, patient history, and epidemiological information. The expected result is Negative.  Fact Sheet for Patients:  BloggerCourse.com  Fact Sheet for Healthcare Providers:  SeriousBroker.it  This test is no t yet approved or cleared by the [place] FDA and  has been authorized for detection and/or diagnosis of SARS-CoV-2 by FDA under an Emergency Use Authorization (EUA). This EUA will remain  in effect (meaning this test can be used) for the duration of the COVID-19 declaration under Section 564(b)(1) of the Act, 21 U.S.C.section 360bbb-3(b)(1), unless the authorization is terminated  or revoked sooner.       Influenza A by PCR NEGATIVE NEGATIVE Final   Influenza B by PCR NEGATIVE NEGATIVE Final    Comment: (NOTE) The Xpert Xpress SARS-CoV-2/FLU/RSV plus assay is intended as an aid in the diagnosis of influenza from Nasopharyngeal swab specimens and should not be used as a sole basis for treatment. Nasal washings and aspirates are unacceptable for Xpert Xpress SARS-CoV-2/FLU/RSV testing.  Fact Sheet for Patients: BloggerCourse.com  Fact Sheet for Healthcare Providers: SeriousBroker.it  This test is not yet approved or cleared by the [place] FDA and has  been authorized for detection and/or diagnosis of SARS-CoV-2 by FDA under an Emergency Use Authorization (EUA). This EUA will remain in effect (meaning this test can be used) for the duration of the COVID-19 declaration under Section 564(b)(1) of the Act, 21 U.S.C. section 360bbb-3(b)(1), unless the authorization is terminated or revoked.  Resp Syncytial Virus by PCR NEGATIVE NEGATIVE Final    Comment: (NOTE) Fact Sheet for Patients: BloggerCourse.com  Fact Sheet for Healthcare Providers: SeriousBroker.it  This test is not yet approved or cleared by the [place] FDA and has been authorized for detection and/or diagnosis of SARS-CoV-2 by FDA under an Emergency Use Authorization (EUA). This EUA will remain in effect (meaning this test can be used) for the duration of the COVID-19 declaration under Section 564(b)(1) of the Act, 21 U.S.C. section 360bbb-3(b)(1), unless the authorization is terminated or revoked.  Performed at [facility], [address], [place], [place] [address]     Radiology Studies: No results found.    Toi Stelly T. Lajoyce Tamura [facility]  If 7PM-7AM, please contact night-coverage [email or web address] 10/12/2022, 2:40 PM

## 2022-10-12 NOTE — Plan of Care (Signed)
Problem: Clinical Measurements: Goal: Diagnostic test results will improve Outcome: Progressing

## 2022-10-13 LAB — RENAL FUNCTION PANEL
Albumin: 4 g/dL (ref 3.5–5.0)
Anion gap: 9 (ref 5–15)
BUN: 14 mg/dL (ref 8–23)
CO2: 28 mmol/L (ref 22–32)
Calcium: 9.2 mg/dL (ref 8.9–10.3)
Chloride: 97 mmol/L — ABNORMAL LOW (ref 98–111)
Creatinine, Ser: 1.23 mg/dL — ABNORMAL HIGH (ref 0.44–1.00)
GFR, Estimated: 48 mL/min — ABNORMAL LOW (ref >60)
Glucose, Bld: 98 mg/dL (ref 70–99)
Phosphorus: 3.9 mg/dL (ref 2.5–4.6)
Potassium: 4.3 mmol/L (ref 3.5–5.1)
Sodium: 134 mmol/L — ABNORMAL LOW (ref 135–145)

## 2022-10-13 LAB — MAGNESIUM: Magnesium: 2.2 mg/dL (ref 1.7–2.4)

## 2022-10-13 MED ORDER — EMPAGLIFLOZIN 10 MG PO TABS: 10.0000 mg | ORAL_TABLET | Freq: Every day | ORAL | 1 refills | Status: DC

## 2022-10-13 MED ORDER — FUROSEMIDE 40 MG PO TABS: 40.0000 mg | ORAL_TABLET | Freq: Every day | ORAL | 1 refills | Status: DC

## 2022-10-13 MED ORDER — CARVEDILOL 6.25 MG PO TABS: 6.2500 mg | ORAL_TABLET | Freq: Two times a day (BID) | ORAL | 1 refills | Status: DC

## 2022-10-13 MED ORDER — ROSUVASTATIN CALCIUM 40 MG PO TABS: 40.0000 mg | ORAL_TABLET | Freq: Every day | ORAL | 1 refills | Status: AC

## 2022-10-13 MED ORDER — SPIRONOLACTONE 25 MG PO TABS: 12.5000 mg | ORAL_TABLET | Freq: Every day | ORAL | 1 refills | Status: DC

## 2022-10-13 NOTE — Discharge Summary (Signed)
Physician Discharge Summary  Samantha Bryant WUJ:[id number] DOB: 03-15-1955 DOA: 10/08/2022  PCP: Irven Coe, MD  Admit date: 10/08/2022 Discharge date: 10/13/2022 Admitted From: Home Disposition: Home Recommendations for Outpatient Follow-up:  Outpatient follow-ups as below Check blood pressure, fluid status, BMP and CBC at follow-up Please follow up on the following pending results: None  Home Health: Not indicated Equipment/Devices: Not indicated  Discharge Condition: Stable CODE STATUS: Full code  Follow-up Information     [facility]. Go in 14 day(s).   Specialty: Cardiology Why: Hospital follow up 10/24/2022 @ 11 am PLEASE bring a current medication list to appointment FREE valet parking, Entrance C, off [employer] information: [address] [place] [address] [phone]        Jodelle Gross, NP Follow up.   Specialties: Cardiology, Radiology, Cardiology Why: Friday Oct 19, 2022 Appt at 2:20 PM (25 min) Contact information: [address] [place] [address] [phone]         Irven Coe, MD. Schedule an appointment as soon as possible for a visit in 1 week(s).   Specialty: Family Medicine Contact information: [address] [place] [address] [phone]                 Hospital course 67 year old F with PMH of obesity, HLD and HTN presenting with cough and DOE, and admitted to heart for heart failure and hypoxic to 85% with ambulation on room air.  Recently diagnosed with COVID-19 2 weeks prior.  BNP elevated to 900.  Troponin 110s.  CTA showed evidence of acute bronchitis, hemoglobin of 10 BNP of 900.  Patient was started on IV diuretics.  Cardiology consulted.  TTE with LVEF of 25 to 30%, RWMA, G2 DD and normal RVSP.  LHC negative for CAD but moderate to severe LVSD.    Patient was diuresed with IV Lasix and then p.o.  diuretics.  I&O incomplete but net -4.2 L.  Developed mild AKI but creatinine plateaued.  Soft blood pressure but improved.  Patient is cleared for discharge on diuretics and GDMT including p.o. Lasix, Jardiance, Aldactone and Coreg.  Crestor increased from 10 to 40 mg daily per cardiology. Outpatient follow-up with cardiology and PCP as above See individual problem list below for more.   Problems addressed during this hospitalization Principal Problem:   Acute CHF (congestive heart failure) (HCC) Active Problems:   Dyspnea on exertion   Dyslipidemia, goal LDL below 70   HTN (hypertension)   Elevated troponin   Acute combined CHF: Presents with cough and dyspnea.  Recently diagnosed with COVID-19 about 2 weeks prior.  BNP was elevated to 900.  CT angio negative for PE but diffuse bronchial wall thickening concerning for bronchitis.  TTE with LVEF of 25 to 30%, G2 DD and RWMA.  LHC negative for CAD.  Diuresed with IV Lasix.  I&O incomplete but net -4.2 L.  -Cleared for discharge by cardiology on p.o. Lasix, Coreg, Jardiance, and Crestor. -Outpatient follow-up as above   Elevated troponins: Likely demand ischemia from the above.  LHC negative.  LDL elevated. -Continue high intensity statin -Cardiac meds as above   AKI: Baseline Cr ~0.7-0.8.  Likely due to diuretics and soft BP.  Creatinine plateaued at 1.2. -Reassess at follow-up   Hypokalemia: Resolved.   Dyslipidemia: LDL 183.  A1c 5.5%.  Lipoprotein elevated as well. -Crestor increased to 40 mg daily.   Normocytic anemia: Stable. -  Commonly known as: ALDACTONE Take 0.5 tablets (12.5 mg total) by mouth daily.        Consultations: Cardiology  Procedures/Studies: 9/18-LHC negative but moderate to severe LVSD.    CARDIAC CATHETERIZATION  Result Date: 10/10/2022   There is moderate to severe left ventricular systolic dysfunction.   LV end diastolic pressure is mildly elevated.   The left ventricular ejection fraction is 25-35% by visual estimate. 1.  Minimal irregularities with no evidence of obstructive coronary artery disease. 2.  Moderately to severely reduced LV systolic function with wall motion abnormalities suggestive of stress-induced cardiomyopathy. 3.  Right heart catheterization showed high normal right and left-sided filling pressures, mild pulmonary hypertension and normal cardiac output. Recommendations: Recommend medical therapy for stress-induced cardiomyopathy. I switched IV furosemide to oral 20 mg once daily.   ECHOCARDIOGRAM COMPLETE  Result Date: 10/08/2022    ECHOCARDIOGRAM REPORT   Patient Name:   Samantha Bryant Date of Exam: 10/08/2022 Medical Rec #:  [id number]    Height:       65.0 in Accession #:    [id number]   Weight:       260.0 lb Date of Birth:  1955-09-15    BSA:          2.211 m Patient Age:    23 years      BP:           110/82 mmHg Patient Gender: F            HR:           78 bpm. Exam Location:  Inpatient Procedure: 2D Echo, Color Doppler and Cardiac Doppler                             MODIFIED REPORT: This report was modified by Olga Millers MD on 10/08/2022 due to Change.  Indications:     I50.40* Unspecified combined systolic (congestive) and                  diastolic (congestive) heart failure  History:         Patient has no prior history of Echocardiogram examinations.                  CHF, Signs/Symptoms:Dyspnea and Shortness of Breath; Risk                  Factors:Dyslipidemia and Hypertension.  Sonographer:     Sheralyn Boatman RDCS Referring Phys:  [id number] MIR Judie Petit [facility] Diagnosing Phys: Olga Millers MD  Sonographer Comments: Patient is obese. IMPRESSIONS  1. Severe hypokinesis of the entire apex with overall severe LV dysfunction; pattern suggestive of stress induced cardiomyopathy.  2. Left ventricular ejection fraction, by estimation, is 25 to 30%. The left ventricle has severely decreased function. The left ventricle demonstrates regional wall motion abnormalities (see scoring diagram/findings for description). The left ventricular internal cavity size was moderately dilated. Left ventricular diastolic parameters are consistent with Grade II diastolic dysfunction (pseudonormalization).  3. Right ventricular systolic function is normal. The right ventricular size is normal.  4. Left atrial size was mildly dilated.  5. A small pericardial effusion is present. The pericardial effusion is circumferential. There is no evidence of cardiac tamponade.  6. The mitral valve is grossly normal. Mild mitral valve regurgitation. No evidence of mitral stenosis.  7. The aortic valve is tricuspid. Aortic valve regurgitation is not visualized. No aortic stenosis is present.  8. The  Commonly known as: ALDACTONE Take 0.5 tablets (12.5 mg total) by mouth daily.        Consultations: Cardiology  Procedures/Studies: 9/18-LHC negative but moderate to severe LVSD.    CARDIAC CATHETERIZATION  Result Date: 10/10/2022   There is moderate to severe left ventricular systolic dysfunction.   LV end diastolic pressure is mildly elevated.   The left ventricular ejection fraction is 25-35% by visual estimate. 1.  Minimal irregularities with no evidence of obstructive coronary artery disease. 2.  Moderately to severely reduced LV systolic function with wall motion abnormalities suggestive of stress-induced cardiomyopathy. 3.  Right heart catheterization showed high normal right and left-sided filling pressures, mild pulmonary hypertension and normal cardiac output. Recommendations: Recommend medical therapy for stress-induced cardiomyopathy. I switched IV furosemide to oral 20 mg once daily.   ECHOCARDIOGRAM COMPLETE  Result Date: 10/08/2022    ECHOCARDIOGRAM REPORT   Patient Name:   Samantha Bryant Date of Exam: 10/08/2022 Medical Rec #:  [id number]    Height:       65.0 in Accession #:    [id number]   Weight:       260.0 lb Date of Birth:  1955-09-15    BSA:          2.211 m Patient Age:    23 years      BP:           110/82 mmHg Patient Gender: F            HR:           78 bpm. Exam Location:  Inpatient Procedure: 2D Echo, Color Doppler and Cardiac Doppler                             MODIFIED REPORT: This report was modified by Olga Millers MD on 10/08/2022 due to Change.  Indications:     I50.40* Unspecified combined systolic (congestive) and                  diastolic (congestive) heart failure  History:         Patient has no prior history of Echocardiogram examinations.                  CHF, Signs/Symptoms:Dyspnea and Shortness of Breath; Risk                  Factors:Dyslipidemia and Hypertension.  Sonographer:     Sheralyn Boatman RDCS Referring Phys:  [id number] MIR Judie Petit [facility] Diagnosing Phys: Olga Millers MD  Sonographer Comments: Patient is obese. IMPRESSIONS  1. Severe hypokinesis of the entire apex with overall severe LV dysfunction; pattern suggestive of stress induced cardiomyopathy.  2. Left ventricular ejection fraction, by estimation, is 25 to 30%. The left ventricle has severely decreased function. The left ventricle demonstrates regional wall motion abnormalities (see scoring diagram/findings for description). The left ventricular internal cavity size was moderately dilated. Left ventricular diastolic parameters are consistent with Grade II diastolic dysfunction (pseudonormalization).  3. Right ventricular systolic function is normal. The right ventricular size is normal.  4. Left atrial size was mildly dilated.  5. A small pericardial effusion is present. The pericardial effusion is circumferential. There is no evidence of cardiac tamponade.  6. The mitral valve is grossly normal. Mild mitral valve regurgitation. No evidence of mitral stenosis.  7. The aortic valve is tricuspid. Aortic valve regurgitation is not visualized. No aortic stenosis is present.  8. The  Physician Discharge Summary  Samantha Bryant WUJ:[id number] DOB: 03-15-1955 DOA: 10/08/2022  PCP: Irven Coe, MD  Admit date: 10/08/2022 Discharge date: 10/13/2022 Admitted From: Home Disposition: Home Recommendations for Outpatient Follow-up:  Outpatient follow-ups as below Check blood pressure, fluid status, BMP and CBC at follow-up Please follow up on the following pending results: None  Home Health: Not indicated Equipment/Devices: Not indicated  Discharge Condition: Stable CODE STATUS: Full code  Follow-up Information     [facility]. Go in 14 day(s).   Specialty: Cardiology Why: Hospital follow up 10/24/2022 @ 11 am PLEASE bring a current medication list to appointment FREE valet parking, Entrance C, off [employer] information: [address] [place] [address] [phone]        Jodelle Gross, NP Follow up.   Specialties: Cardiology, Radiology, Cardiology Why: Friday Oct 19, 2022 Appt at 2:20 PM (25 min) Contact information: [address] [place] [address] [phone]         Irven Coe, MD. Schedule an appointment as soon as possible for a visit in 1 week(s).   Specialty: Family Medicine Contact information: [address] [place] [address] [phone]                 Hospital course 67 year old F with PMH of obesity, HLD and HTN presenting with cough and DOE, and admitted to heart for heart failure and hypoxic to 85% with ambulation on room air.  Recently diagnosed with COVID-19 2 weeks prior.  BNP elevated to 900.  Troponin 110s.  CTA showed evidence of acute bronchitis, hemoglobin of 10 BNP of 900.  Patient was started on IV diuretics.  Cardiology consulted.  TTE with LVEF of 25 to 30%, RWMA, G2 DD and normal RVSP.  LHC negative for CAD but moderate to severe LVSD.    Patient was diuresed with IV Lasix and then p.o.  diuretics.  I&O incomplete but net -4.2 L.  Developed mild AKI but creatinine plateaued.  Soft blood pressure but improved.  Patient is cleared for discharge on diuretics and GDMT including p.o. Lasix, Jardiance, Aldactone and Coreg.  Crestor increased from 10 to 40 mg daily per cardiology. Outpatient follow-up with cardiology and PCP as above See individual problem list below for more.   Problems addressed during this hospitalization Principal Problem:   Acute CHF (congestive heart failure) (HCC) Active Problems:   Dyspnea on exertion   Dyslipidemia, goal LDL below 70   HTN (hypertension)   Elevated troponin   Acute combined CHF: Presents with cough and dyspnea.  Recently diagnosed with COVID-19 about 2 weeks prior.  BNP was elevated to 900.  CT angio negative for PE but diffuse bronchial wall thickening concerning for bronchitis.  TTE with LVEF of 25 to 30%, G2 DD and RWMA.  LHC negative for CAD.  Diuresed with IV Lasix.  I&O incomplete but net -4.2 L.  -Cleared for discharge by cardiology on p.o. Lasix, Coreg, Jardiance, and Crestor. -Outpatient follow-up as above   Elevated troponins: Likely demand ischemia from the above.  LHC negative.  LDL elevated. -Continue high intensity statin -Cardiac meds as above   AKI: Baseline Cr ~0.7-0.8.  Likely due to diuretics and soft BP.  Creatinine plateaued at 1.2. -Reassess at follow-up   Hypokalemia: Resolved.   Dyslipidemia: LDL 183.  A1c 5.5%.  Lipoprotein elevated as well. -Crestor increased to 40 mg daily.   Normocytic anemia: Stable. -  Physician Discharge Summary  Samantha Bryant WUJ:[id number] DOB: 03-15-1955 DOA: 10/08/2022  PCP: Irven Coe, MD  Admit date: 10/08/2022 Discharge date: 10/13/2022 Admitted From: Home Disposition: Home Recommendations for Outpatient Follow-up:  Outpatient follow-ups as below Check blood pressure, fluid status, BMP and CBC at follow-up Please follow up on the following pending results: None  Home Health: Not indicated Equipment/Devices: Not indicated  Discharge Condition: Stable CODE STATUS: Full code  Follow-up Information     [facility]. Go in 14 day(s).   Specialty: Cardiology Why: Hospital follow up 10/24/2022 @ 11 am PLEASE bring a current medication list to appointment FREE valet parking, Entrance C, off [employer] information: [address] [place] [address] [phone]        Jodelle Gross, NP Follow up.   Specialties: Cardiology, Radiology, Cardiology Why: Friday Oct 19, 2022 Appt at 2:20 PM (25 min) Contact information: [address] [place] [address] [phone]         Irven Coe, MD. Schedule an appointment as soon as possible for a visit in 1 week(s).   Specialty: Family Medicine Contact information: [address] [place] [address] [phone]                 Hospital course 67 year old F with PMH of obesity, HLD and HTN presenting with cough and DOE, and admitted to heart for heart failure and hypoxic to 85% with ambulation on room air.  Recently diagnosed with COVID-19 2 weeks prior.  BNP elevated to 900.  Troponin 110s.  CTA showed evidence of acute bronchitis, hemoglobin of 10 BNP of 900.  Patient was started on IV diuretics.  Cardiology consulted.  TTE with LVEF of 25 to 30%, RWMA, G2 DD and normal RVSP.  LHC negative for CAD but moderate to severe LVSD.    Patient was diuresed with IV Lasix and then p.o.  diuretics.  I&O incomplete but net -4.2 L.  Developed mild AKI but creatinine plateaued.  Soft blood pressure but improved.  Patient is cleared for discharge on diuretics and GDMT including p.o. Lasix, Jardiance, Aldactone and Coreg.  Crestor increased from 10 to 40 mg daily per cardiology. Outpatient follow-up with cardiology and PCP as above See individual problem list below for more.   Problems addressed during this hospitalization Principal Problem:   Acute CHF (congestive heart failure) (HCC) Active Problems:   Dyspnea on exertion   Dyslipidemia, goal LDL below 70   HTN (hypertension)   Elevated troponin   Acute combined CHF: Presents with cough and dyspnea.  Recently diagnosed with COVID-19 about 2 weeks prior.  BNP was elevated to 900.  CT angio negative for PE but diffuse bronchial wall thickening concerning for bronchitis.  TTE with LVEF of 25 to 30%, G2 DD and RWMA.  LHC negative for CAD.  Diuresed with IV Lasix.  I&O incomplete but net -4.2 L.  -Cleared for discharge by cardiology on p.o. Lasix, Coreg, Jardiance, and Crestor. -Outpatient follow-up as above   Elevated troponins: Likely demand ischemia from the above.  LHC negative.  LDL elevated. -Continue high intensity statin -Cardiac meds as above   AKI: Baseline Cr ~0.7-0.8.  Likely due to diuretics and soft BP.  Creatinine plateaued at 1.2. -Reassess at follow-up   Hypokalemia: Resolved.   Dyslipidemia: LDL 183.  A1c 5.5%.  Lipoprotein elevated as well. -Crestor increased to 40 mg daily.   Normocytic anemia: Stable. -  Commonly known as: ALDACTONE Take 0.5 tablets (12.5 mg total) by mouth daily.        Consultations: Cardiology  Procedures/Studies: 9/18-LHC negative but moderate to severe LVSD.    CARDIAC CATHETERIZATION  Result Date: 10/10/2022   There is moderate to severe left ventricular systolic dysfunction.   LV end diastolic pressure is mildly elevated.   The left ventricular ejection fraction is 25-35% by visual estimate. 1.  Minimal irregularities with no evidence of obstructive coronary artery disease. 2.  Moderately to severely reduced LV systolic function with wall motion abnormalities suggestive of stress-induced cardiomyopathy. 3.  Right heart catheterization showed high normal right and left-sided filling pressures, mild pulmonary hypertension and normal cardiac output. Recommendations: Recommend medical therapy for stress-induced cardiomyopathy. I switched IV furosemide to oral 20 mg once daily.   ECHOCARDIOGRAM COMPLETE  Result Date: 10/08/2022    ECHOCARDIOGRAM REPORT   Patient Name:   Samantha Bryant Date of Exam: 10/08/2022 Medical Rec #:  [id number]    Height:       65.0 in Accession #:    [id number]   Weight:       260.0 lb Date of Birth:  1955-09-15    BSA:          2.211 m Patient Age:    23 years      BP:           110/82 mmHg Patient Gender: F            HR:           78 bpm. Exam Location:  Inpatient Procedure: 2D Echo, Color Doppler and Cardiac Doppler                             MODIFIED REPORT: This report was modified by Olga Millers MD on 10/08/2022 due to Change.  Indications:     I50.40* Unspecified combined systolic (congestive) and                  diastolic (congestive) heart failure  History:         Patient has no prior history of Echocardiogram examinations.                  CHF, Signs/Symptoms:Dyspnea and Shortness of Breath; Risk                  Factors:Dyslipidemia and Hypertension.  Sonographer:     Sheralyn Boatman RDCS Referring Phys:  [id number] MIR Judie Petit [facility] Diagnosing Phys: Olga Millers MD  Sonographer Comments: Patient is obese. IMPRESSIONS  1. Severe hypokinesis of the entire apex with overall severe LV dysfunction; pattern suggestive of stress induced cardiomyopathy.  2. Left ventricular ejection fraction, by estimation, is 25 to 30%. The left ventricle has severely decreased function. The left ventricle demonstrates regional wall motion abnormalities (see scoring diagram/findings for description). The left ventricular internal cavity size was moderately dilated. Left ventricular diastolic parameters are consistent with Grade II diastolic dysfunction (pseudonormalization).  3. Right ventricular systolic function is normal. The right ventricular size is normal.  4. Left atrial size was mildly dilated.  5. A small pericardial effusion is present. The pericardial effusion is circumferential. There is no evidence of cardiac tamponade.  6. The mitral valve is grossly normal. Mild mitral valve regurgitation. No evidence of mitral stenosis.  7. The aortic valve is tricuspid. Aortic valve regurgitation is not visualized. No aortic stenosis is present.  8. The  Physician Discharge Summary  Samantha Bryant WUJ:[id number] DOB: 03-15-1955 DOA: 10/08/2022  PCP: Irven Coe, MD  Admit date: 10/08/2022 Discharge date: 10/13/2022 Admitted From: Home Disposition: Home Recommendations for Outpatient Follow-up:  Outpatient follow-ups as below Check blood pressure, fluid status, BMP and CBC at follow-up Please follow up on the following pending results: None  Home Health: Not indicated Equipment/Devices: Not indicated  Discharge Condition: Stable CODE STATUS: Full code  Follow-up Information     [facility]. Go in 14 day(s).   Specialty: Cardiology Why: Hospital follow up 10/24/2022 @ 11 am PLEASE bring a current medication list to appointment FREE valet parking, Entrance C, off [employer] information: [address] [place] [address] [phone]        Jodelle Gross, NP Follow up.   Specialties: Cardiology, Radiology, Cardiology Why: Friday Oct 19, 2022 Appt at 2:20 PM (25 min) Contact information: [address] [place] [address] [phone]         Irven Coe, MD. Schedule an appointment as soon as possible for a visit in 1 week(s).   Specialty: Family Medicine Contact information: [address] [place] [address] [phone]                 Hospital course 67 year old F with PMH of obesity, HLD and HTN presenting with cough and DOE, and admitted to heart for heart failure and hypoxic to 85% with ambulation on room air.  Recently diagnosed with COVID-19 2 weeks prior.  BNP elevated to 900.  Troponin 110s.  CTA showed evidence of acute bronchitis, hemoglobin of 10 BNP of 900.  Patient was started on IV diuretics.  Cardiology consulted.  TTE with LVEF of 25 to 30%, RWMA, G2 DD and normal RVSP.  LHC negative for CAD but moderate to severe LVSD.    Patient was diuresed with IV Lasix and then p.o.  diuretics.  I&O incomplete but net -4.2 L.  Developed mild AKI but creatinine plateaued.  Soft blood pressure but improved.  Patient is cleared for discharge on diuretics and GDMT including p.o. Lasix, Jardiance, Aldactone and Coreg.  Crestor increased from 10 to 40 mg daily per cardiology. Outpatient follow-up with cardiology and PCP as above See individual problem list below for more.   Problems addressed during this hospitalization Principal Problem:   Acute CHF (congestive heart failure) (HCC) Active Problems:   Dyspnea on exertion   Dyslipidemia, goal LDL below 70   HTN (hypertension)   Elevated troponin   Acute combined CHF: Presents with cough and dyspnea.  Recently diagnosed with COVID-19 about 2 weeks prior.  BNP was elevated to 900.  CT angio negative for PE but diffuse bronchial wall thickening concerning for bronchitis.  TTE with LVEF of 25 to 30%, G2 DD and RWMA.  LHC negative for CAD.  Diuresed with IV Lasix.  I&O incomplete but net -4.2 L.  -Cleared for discharge by cardiology on p.o. Lasix, Coreg, Jardiance, and Crestor. -Outpatient follow-up as above   Elevated troponins: Likely demand ischemia from the above.  LHC negative.  LDL elevated. -Continue high intensity statin -Cardiac meds as above   AKI: Baseline Cr ~0.7-0.8.  Likely due to diuretics and soft BP.  Creatinine plateaued at 1.2. -Reassess at follow-up   Hypokalemia: Resolved.   Dyslipidemia: LDL 183.  A1c 5.5%.  Lipoprotein elevated as well. -Crestor increased to 40 mg daily.   Normocytic anemia: Stable. -

## 2022-10-17 NOTE — Progress Notes (Signed)
Cardiology Clinic Note   Patient Name: Samantha Bryant Date of Encounter: 10/19/2022  Primary Care Provider:  Irven Coe, MD Primary Cardiologist:  Chrystie Nose, MD  Patient Profile    67 year old female with history of hyperlipidemia hypertension, and obesity, admitted to the hospital from 10/08/2022 through 10/13/2018 for with acute respiratory failure and hypoxia along with heart failure, LVEF of 25 to 30% with grade 2 diastolic dysfunction per echocardiogram.  Left heart catheterization was negative for CAD with moderate to severe LVSD.    She was diuresed with IV Lasix, developed mild AKI but this improved and plateaued she was sent home on Lasix, Jardiance, Aldactone, and carvedilol.  Rosuvastatin was increased from 10 to 40 mg daily.  Past Medical History    Past Medical History:  Diagnosis Date   Hypertension    Past Surgical History:  Procedure Laterality Date   ABDOMINAL HYSTERECTOMY     CARPAL TUNNEL RELEASE     KNEE ARTHROSCOPY     RIGHT/LEFT HEART CATH AND CORONARY ANGIOGRAPHY N/A 10/10/2022   Procedure: RIGHT/LEFT HEART CATH AND CORONARY ANGIOGRAPHY;  Surgeon: Iran Ouch, MD;  Location: [facility];  Service: Cardiovascular;  Laterality: N/A;   TONSILLECTOMY      Allergies  Allergies  Allergen Reactions   Hydrocodone-Acetaminophen Other (See Comments)   Topiramate Other (See Comments)    Other Reaction(s): Worse headache   Hydrocodone     Severe Headache    History of Present Illness    Samantha Bryant returns to the office today for ongoing assessment and management of chronic HFrEF EF of 25% to 30%, status post hospitalization for same, she had COVID infection early September 2024 and presented with worsening cough and congestion.  She was diuresed 2 L.  Other history includes hypertension, dyslipidemia, and obesity.  She comes today feeling tired.  Her energy level remains diminished but better than when she was in the hospital.  She has returned  to work at [employer].  She is to work there for 90 days and then she will be able to receive insurance.  She is unable to afford Jardiance and has not been taking this due to the cost.  She continues to take her other medications as directed, weight has been steady since being discharged, she denies any PND, orthopnea, or edema.  Home Medications    Current Outpatient Medications  Medication Sig Dispense Refill   benzonatate (TESSALON) 100 MG capsule Take 1 capsule (100 mg total) by mouth every 8 (eight) hours. 21 capsule 0   carvedilol (COREG) 6.25 MG tablet Take 1 tablet (6.25 mg total) by mouth 2 (two) times daily with a meal. 180 tablet 1   empagliflozin (JARDIANCE) 10 MG TABS tablet Take 1 tablet (10 mg total) by mouth daily. 90 tablet 1   empagliflozin (JARDIANCE) 10 MG TABS tablet Take 1 tablet (10 mg total) by mouth daily before breakfast. 30 tablet 3   furosemide (LASIX) 40 MG tablet Take 1 tablet (40 mg total) by mouth daily. 30 tablet 1   rosuvastatin (CRESTOR) 40 MG tablet Take 1 tablet (40 mg total) by mouth at bedtime. 90 tablet 1   spironolactone (ALDACTONE) 25 MG tablet Take 0.5 tablets (12.5 mg total) by mouth daily. (Patient taking differently: Take 25 mg by mouth daily.) 30 tablet 1   No current facility-administered medications for this visit.     Family History    Family History  Problem Relation Age of Onset   Diabetes  Mother    Glaucoma Mother    Breast cancer Neg Hx    She indicated that the status of her mother is unknown. She indicated that the status of her neg hx is unknown.  Social History    Social History   Socioeconomic History   Marital status: Single    Spouse name: Not on file   Number of children: 2   Years of education: Not on file   Highest education level: Some college, no degree  Occupational History   Occupation: no   Occupation: [employer]  Tobacco Use   Smoking status: Former    Types: Cigarettes   Smokeless tobacco: Never  Vaping Use    Vaping status: Never Used  Substance and Sexual Activity   Alcohol use: No   Drug use: No   Sexual activity: Not on file  Other Topics Concern   Not on file  Social History Narrative   Not on file   Social Determinants of Health   Financial Resource Strain: High Risk (10/09/2022)   Overall Financial Resource Strain (CARDIA)    Difficulty of Paying Living Expenses: Hard  Food Insecurity: No Food Insecurity (10/08/2022)   Hunger Vital Sign    Worried About Running Out of Food in the Last Year: Never true    Ran Out of Food in the Last Year: Never true  Transportation Needs: No Transportation Needs (10/08/2022)   PRAPARE - Administrator, Civil Service (Medical): No    Lack of Transportation (Non-Medical): No  Physical Activity: Not on file  Stress: Not on file  Social Connections: Unknown (06/05/2021)   Received from [facility], [facility]   Social Network    Social Network: Not on file  Intimate Partner Violence: Not At Risk (10/08/2022)   Humiliation, Afraid, Rape, and Kick questionnaire    Fear of Current or Ex-Partner: No    Emotionally Abused: No    Physically Abused: No    Sexually Abused: No     Review of Systems    General:  No chills, fever, night sweats or weight changes.  Cardiovascular:  No chest pain, mild dyspnea on exertion, edema, orthopnea, palpitations, paroxysmal nocturnal dyspnea. Dermatological: No rash, lesions/masses Respiratory: No cough, dyspnea Urologic: No hematuria, dysuria Abdominal:   No nausea, vomiting, diarrhea, bright red blood per rectum, melena, or hematemesis Neurologic:  No visual changes, wkns, changes in mental status. All other systems reviewed and are otherwise negative except as noted above.    Physical Exam    VS:  BP 110/60 (BP Location: Left Arm, Patient Position: Sitting, Cuff Size: Normal)   Pulse 65   Ht 5\' 5"  (1.651 m)   Wt 265 lb 9.6 oz (120.5 kg)   SpO2 95%   BMI 44.20 kg/m  , BMI Body mass index is  44.2 kg/m.     GEN: Well nourished, well developed, in no acute distress. HEENT: normal. Neck: Supple, no JVD, carotid bruits, or masses. Cardiac: RRR, distant heart sounds, no murmurs, rubs, or gallops. No clubbing, cyanosis, edema.  Radials/DP/PT 2+ and equal bilaterally.  Respiratory:  Respirations regular and unlabored, clear to auscultation bilaterally. GI: Soft, nontender, nondistended, BS + x 4. MS: no deformity or atrophy. Skin: warm and dry, no rash. Neuro:  Strength and sensation are intact. Psych: Normal affect.      Lab Results  Component Value Date   WBC 5.4 10/12/2022   HGB 12.1 10/12/2022   HCT 37.7 10/12/2022   MCV  91.7 10/12/2022   PLT 232 10/12/2022   Lab Results  Component Value Date   CREATININE 1.23 (H) 10/13/2022   BUN 14 10/13/2022   NA 134 (L) 10/13/2022   K 4.3 10/13/2022   CL 97 (L) 10/13/2022   CO2 28 10/13/2022   Lab Results  Component Value Date   ALT 18 10/08/2022   AST 20 10/08/2022   ALKPHOS 57 10/08/2022   BILITOT 0.8 10/08/2022   Lab Results  Component Value Date   CHOL 255 (H) 10/08/2022   HDL 55 10/08/2022   LDLCALC 183 (H) 10/08/2022   TRIG 87 10/08/2022   CHOLHDL 4.6 10/08/2022    Lab Results  Component Value Date   HGBA1C 5.5 10/11/2022     Review of Prior Studies [facility] 10/10/2022     There is moderate to severe left ventricular systolic dysfunction.   LV end diastolic pressure is mildly elevated.   The left ventricular ejection fraction is 25-35% by visual estimate.   1.  Minimal irregularities with no evidence of obstructive coronary artery disease. 2.  Moderately to severely reduced LV systolic function with wall motion abnormalities suggestive of stress-induced cardiomyopathy. 3.  Right heart catheterization showed high normal right and left-sided filling pressures, mild pulmonary hypertension and normal cardiac output.   Recommendations: Recommend medical therapy for stress-induced  cardiomyopathy.  Echocardiogram 10/08/2022 1. Severe hypokinesis of the entire apex with overall severe LV  dysfunction; pattern suggestive of stress induced cardiomyopathy.   2. Left ventricular ejection fraction, by estimation, is 25 to 30%. The  left ventricle has severely decreased function. The left ventricle  demonstrates regional wall motion abnormalities (see scoring  diagram/findings for description). The left  ventricular internal cavity size was moderately dilated. Left ventricular  diastolic parameters are consistent with Grade II diastolic dysfunction  (pseudonormalization).   3. Right ventricular systolic function is normal. The right ventricular  size is normal.   4. Left atrial size was mildly dilated.   5. A small pericardial effusion is present. The pericardial effusion is  circumferential. There is no evidence of cardiac tamponade.   6. The mitral valve is grossly normal. Mild mitral valve regurgitation.  No evidence of mitral stenosis.   7. The aortic valve is tricuspid. Aortic valve regurgitation is not  visualized. No aortic stenosis is present.   8. The inferior vena cava is dilated in size with <50% respiratory  variability, suggesting right atrial pressure of 15 mmHg.    Assessment & Plan   1.  Chronic HFrEF: Recent hospitalization with significant respiratory distress echocardiogram with EF of 25% to 30%.  She has been placed on spironolactone 25 mg daily, carvedilol 6.25 mg twice daily, furosemide 40 mg daily.  [place] Pepper has been unaffordable to her.  We are giving her samples today and we are sending her to community health and wellness to assist with prescriptions until she is able to obtain insurance in the next 6 weeks after she is at her job for 90 days at [employer].  At some point would like to consider putting her on Entresto.  Will see her again in a month to see how she is doing with the BMET.  She is to continue salt avoidance, daily weights.  Consider  repeating echocardiogram to evaluate for pericardial effusion.  2.  Hypercholesterolemia: Patient is on rosuvastatin 40 mg daily.  Will need to repeat these in 3 months.  Goal of LDL 70.  3.  Hypertension: Blood pressure is low and optimal for  her current LV systolic function.  Will await to increase her Entresto for now.  She is being given samples of Jardiance 10 mg daily.  Will follow-up with her blood pressure to evaluate her response.  Continue daily weights and blood pressure recordings.       Signed, Bettey Mare. Liborio Nixon, ANP, AACC   10/19/2022 3:11 PM      Office [phone] Fax [phone]  Notice: This dictation was prepared with Dragon dictation along with smaller phrase technology. Any transcriptional errors that result from this process are unintentional and may not be corrected upon review.

## 2022-10-19 ENCOUNTER — Ambulatory Visit: Payer: No Typology Code available for payment source | Attending: Adult Health | Admitting: Adult Health

## 2022-10-19 ENCOUNTER — Other Ambulatory Visit (HOSPITAL_COMMUNITY): Payer: Self-pay

## 2022-10-19 ENCOUNTER — Encounter: Payer: Self-pay | Admitting: Adult Health

## 2022-10-19 VITALS — BP 110/60 | HR 65 | Ht 65.0 in | Wt 265.6 lb

## 2022-10-19 DIAGNOSIS — I5041 Acute combined systolic (congestive) and diastolic (congestive) heart failure: Secondary | ICD-10-CM

## 2022-10-19 DIAGNOSIS — R0609 Other forms of dyspnea: Secondary | ICD-10-CM

## 2022-10-19 MED ORDER — EMPAGLIFLOZIN 10 MG PO TABS
10.0000 mg | ORAL_TABLET | Freq: Every day | ORAL | 3 refills | Status: DC
  Filled 2022-10-19 – 2023-01-01 (×2): qty 30, 30d supply, fill #0
  Filled 2023-01-29: qty 30, 30d supply, fill #1
  Filled 2023-03-05: qty 30, 30d supply, fill #2

## 2022-10-19 MED ORDER — EMPAGLIFLOZIN 10 MG PO TABS: 10.0000 mg | ORAL_TABLET | Freq: Every day | ORAL | Status: DC

## 2022-10-19 NOTE — Patient Instructions (Signed)
Medication Instructions:  START JARDIANCE 10 MG DAILY.    Lab Work: BMET    Testing/Procedures: NONE   Follow-Up: At [facility], you and your health needs are our priority.  As part of our continuing mission to provide you with exceptional heart care, we have created designated Provider Care Teams.  These Care Teams include your primary Cardiologist (physician) and Advanced Practice Providers (APPs -  Physician Assistants and Nurse Practitioners) who all work together to provide you with the care you need, when you need it.    Your next appointment:   1 MONTH  Provider:   Joni Reining or Dr. Rennis Golden

## 2022-10-22 ENCOUNTER — Other Ambulatory Visit (HOSPITAL_COMMUNITY): Payer: Self-pay

## 2022-10-22 ENCOUNTER — Telehealth: Payer: Self-pay | Admitting: Adult Health

## 2022-10-22 NOTE — Telephone Encounter (Signed)
Does she have any insurance other than workers comp?  Is she doesn't have coverage at all, please have her apply for patient assistance. She will need to get medicare part D next year.

## 2022-10-22 NOTE — Telephone Encounter (Signed)
Spoke with patient and she will apply for patient assistance.

## 2022-10-22 NOTE — Telephone Encounter (Signed)
Pt c/o medication issue:  1. Name of Medication: Jardiance  2. How are you currently taking this medication (dosage and times per day)?   3. Are you having a reaction (difficulty breathing--STAT)?   4. What is your medication issue? She went to the pharmacy to get this medicine. She had a discount card and it did not work. She said the medicine was going to cost $500. She says she can not afford the [place]

## 2022-10-22 NOTE — Telephone Encounter (Signed)
Patient states first time use of Coupon.  She does not have commercial insurance so that was the reason it did not work.  She ask what her next step is

## 2022-10-23 ENCOUNTER — Telehealth (HOSPITAL_COMMUNITY): Payer: Self-pay

## 2022-10-23 NOTE — Telephone Encounter (Signed)
Called to confirm Heart & Vascular Transitions of Care appointment. Patient reminded to bring all medications and pill box organizer with them. Confirmed patient has transportation. Gave directions, instructed to utilize valet parking.  Confirmed appointment prior to ending call.   

## 2022-10-24 ENCOUNTER — Ambulatory Visit (HOSPITAL_COMMUNITY)
Admit: 2022-10-24 | Discharge: 2022-10-24 | Disposition: A | Discharge status: Home / Self Care | Payer: Medicaid Other | Source: Ambulatory Visit | Attending: Cardiology | Admitting: Cardiology

## 2022-10-24 ENCOUNTER — Encounter (HOSPITAL_COMMUNITY): Payer: Self-pay

## 2022-10-24 ENCOUNTER — Other Ambulatory Visit (HOSPITAL_COMMUNITY): Payer: Self-pay

## 2022-10-24 ENCOUNTER — Telehealth (HOSPITAL_COMMUNITY): Payer: Self-pay

## 2022-10-24 VITALS — BP 90/50 | HR 51 | Wt 265.8 lb

## 2022-10-24 DIAGNOSIS — I5022 Chronic systolic (congestive) heart failure: Secondary | ICD-10-CM

## 2022-10-24 DIAGNOSIS — U099 Post covid-19 condition, unspecified: Secondary | ICD-10-CM | POA: Diagnosis not present

## 2022-10-24 DIAGNOSIS — R001 Bradycardia, unspecified: Secondary | ICD-10-CM | POA: Insufficient documentation

## 2022-10-24 DIAGNOSIS — E669 Obesity, unspecified: Secondary | ICD-10-CM | POA: Diagnosis present

## 2022-10-24 DIAGNOSIS — I110 Hypertensive heart disease with heart failure: Secondary | ICD-10-CM | POA: Diagnosis present

## 2022-10-24 DIAGNOSIS — I428 Other cardiomyopathies: Secondary | ICD-10-CM | POA: Diagnosis not present

## 2022-10-24 DIAGNOSIS — Z5971 Insufficient health insurance coverage: Secondary | ICD-10-CM | POA: Insufficient documentation

## 2022-10-24 DIAGNOSIS — I10 Essential (primary) hypertension: Secondary | ICD-10-CM | POA: Diagnosis present

## 2022-10-24 LAB — BASIC METABOLIC PANEL
Anion gap: 8 (ref 5–15)
BUN: 20 mg/dL (ref 8–23)
CO2: 27 mmol/L (ref 22–32)
Calcium: 9.4 mg/dL (ref 8.9–10.3)
Chloride: 103 mmol/L (ref 98–111)
Creatinine, Ser: 2.21 mg/dL — ABNORMAL HIGH (ref 0.44–1.00)
GFR, Estimated: 24 mL/min — ABNORMAL LOW (ref >60)
Glucose, Bld: 109 mg/dL — ABNORMAL HIGH (ref 70–99)
Potassium: 3.5 mmol/L (ref 3.5–5.1)
Sodium: 138 mmol/L (ref 135–145)

## 2022-10-24 LAB — BRAIN NATRIURETIC PEPTIDE: B Natriuretic Peptide: 86.8 pg/mL (ref 0.0–100.0)

## 2022-10-24 MED ORDER — CARVEDILOL 6.25 MG PO TABS: 6.2500 mg | ORAL_TABLET | Freq: Two times a day (BID) | ORAL | 1 refills | Status: DC

## 2022-10-24 MED ORDER — LOSARTAN POTASSIUM 25 MG PO TABS: 12.5000 mg | ORAL_TABLET | Freq: Every day | ORAL | 3 refills | Status: DC

## 2022-10-24 MED ORDER — FUROSEMIDE 40 MG PO TABS: 40.0000 mg | ORAL_TABLET | ORAL | 3 refills | Status: DC | PRN

## 2022-10-24 MED ORDER — LOSARTAN POTASSIUM 25 MG PO TABS: 25.0000 mg | ORAL_TABLET | Freq: Every day | ORAL | 3 refills | Status: DC

## 2022-10-24 NOTE — Progress Notes (Signed)
HEART & VASCULAR TRANSITION OF CARE CONSULT NOTE     Referring Physician: Dr. Alanda Slim Primary Care: Irven Coe, MD Primary Cardiologist: Chrystie Nose, MD  HPI: Referred to clinic by Dr. Alanda Slim for heart failure consultation.   This is a 67 year old female with a history of HTN, DLD and obesity recently admitted 9/24 after several weeks of progressive dyspnea on exertion, cough and fatigue following COVID-19 infection. Diagnosed w/ acute CHF. Echo EF 25-30% w/ severe hypokinesis of the entire apex with overall severe LV dysfunction; pattern suggestive of stress induced cardiomyopathy. RV normal. Hs trop 118>>120. 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 RHC showed high normal right and left-sided filling pressures, mild pulmonary hypertension and normal cardiac output (see complete hemodynamics below in cath report). She was continued on IV Lasix for further diuresis then transitioned to PO Lasix. GDMT added for systolic heart failure. Referred to TOC at d/c.   Of note, she has had some difficulties affording Jardiance. She has been supplied by samples from cardiology clinic. She just started a new job in August and will obtain insurance benefit after 90 days of employment.   She presents to clinic today for assessment. Overall feels much better w/ improved functional status but still SOB w/ exertion. Improved from NYHA Class IIIb>>II. Volume assessment difficult on exam given body habitus but she has been checking wt daily at home and reports home wts have been stable and c/w discharge wt. BP soft in clinic 90/50 but no orthostatic symptoms. Checks BP regularly at home and has been stable in the 110s/80s. Pulse rate also low at 51 bpm. She report even lower  readings at home. Pulse rate was 46 bpm yesterday morning. She complains of fatigue.      Cardiac Testing   2D Echo 10/08/22 1. Severe hypokinesis of the entire apex with overall severe LV  dysfunction; pattern suggestive of stress induced cardiomyopathy.   2. Left ventricular ejection fraction, by estimation, is 25 to 30%. The  left ventricle has severely decreased function. The left ventricle  demonstrates regional wall motion abnormalities (see scoring  diagram/findings for description). The left  ventricular internal cavity size was moderately dilated. Left ventricular  diastolic parameters are consistent with Grade II diastolic dysfunction  (pseudonormalization).   3. Right ventricular systolic function is normal. The right ventricular  size is normal.   4. Left atrial size was mildly dilated.   5. A small pericardial effusion is present. The pericardial effusion is  circumferential. There is no evidence of cardiac tamponade.   6. The mitral valve is grossly normal. Mild mitral valve regurgitation.  No evidence of mitral stenosis.   7. The aortic valve is tricuspid. Aortic valve regurgitation is not  visualized. No aortic stenosis is present.   8. The inferior vena cava is dilated in size with <50% respiratory  variability, suggesting right atrial pressure of 15 mmHg.    R/LHC 10/10/22   There is moderate to severe left ventricular systolic dysfunction.   LV end diastolic pressure is mildly elevated.   The left ventricular ejection fraction is 25-35% by visual estimate.   1.  Minimal irregularities with no evidence of obstructive coronary artery disease. 2.  Moderately to severely reduced LV systolic function with wall motion abnormalities suggestive of stress-induced cardiomyopathy. 3.  Right heart catheterization showed high  normal right and left-sided filling pressures, mild pulmonary hypertension and normal cardiac output.   Fick Cardiac Output 6.66 L/min  Fick Cardiac  Output Index 2.94 (L/min)/BSA  RA A Wave 13 mmHg  RA V Wave 10 mmHg  RA Mean 11 mmHg  RV Systolic Pressure 34 mmHg  RV Diastolic Pressure 2 mmHg  RV EDP 13 mmHg  PA Systolic Pressure 34 mmHg  PA Diastolic Pressure 12 mmHg  PA Mean 29 mmHg  PW A Wave 19 mmHg  PW V Wave 18 mmHg  PW Mean 11 mmHg  AO Systolic Pressure 117 mmHg  AO Diastolic Pressure 73 mmHg  AO Mean 94 mmHg  LV Systolic Pressure 104 mmHg  LV Diastolic Pressure 14 mmHg  LV EDP 22 mmHg  AOp Systolic Pressure 119 mmHg  AOp Diastolic Pressure 80 mmHg  AOp Mean Pressure 94 mmHg  LVp Systolic Pressure 98 mmHg  LVp Diastolic Pressure 50 mmHg  LVp EDP Pressure 24 mmHg  QP/QS 1  TPVR Index 9.87 HRUI  TSVR Index 31.99 HRUI  PVR SVR Ratio 0.22  TPVR/TSVR Ratio 0.31     Review of Systems: [y] = yes, [ ]  = no   General: Weight gain [ ] ; Weight loss [ ] ; Anorexia [ ] ; Fatigue [Y ]; Fever [ ] ; Chills [ ] ; Weakness [ ]   Cardiac: Chest pain/pressure [ ] ; Resting SOB [ ] ; Exertional SOB [ Y]; Orthopnea [ ] ; Pedal Edema [ ] ; Palpitations [ ] ; Syncope [ ] ; Presyncope [ ] ; Paroxysmal nocturnal dyspnea[ ]   Pulmonary: Cough [ ] ; Wheezing[ ] ; Hemoptysis[ ] ; Sputum [ ] ; Snoring [ ]   GI: Vomiting[ ] ; Dysphagia[ ] ; Melena[ ] ; Hematochezia [ ] ; Heartburn[ ] ; Abdominal pain [ ] ; Constipation [ ] ; Diarrhea [ ] ; BRBPR [ ]   GU: Hematuria[ ] ; Dysuria [ ] ; Nocturia[ ]   Vascular: Pain in legs with walking [ ] ; Pain in feet with lying flat [ ] ; Non-healing sores [ ] ; Stroke [ ] ; TIA [ ] ; Slurred speech [ ] ;  Neuro: Headaches[ ] ; Vertigo[ ] ; Seizures[ ] ; Paresthesias[ ] ;Blurred vision [ ] ; Diplopia [ ] ; Vision changes [ ]   Ortho/Skin: Arthritis [ ] ; Joint pain [ ] ; Muscle pain [ ] ; Joint swelling [ ] ; Back Pain [ ] ; Rash [ ]   Psych: Depression[ ] ; Anxiety[ ]   Heme: Bleeding problems [ ] ; Clotting disorders [ ] ; Anemia [ ]   Endocrine: Diabetes [ ] ; Thyroid dysfunction[ ]    Past Medical History:  Diagnosis Date   Hypertension     Current  Outpatient Medications  Medication Sig Dispense Refill   empagliflozin (JARDIANCE) 10 MG TABS tablet Take 1 tablet (10 mg total) by mouth daily before breakfast. 30 tablet 3   furosemide (LASIX) 40 MG tablet Take 1 tablet (40 mg total) by mouth daily. 30 tablet 1   rosuvastatin (CRESTOR) 40 MG tablet Take 1 tablet (40 mg total) by mouth at bedtime. 90 tablet 1   spironolactone (ALDACTONE) 25 MG tablet Take 25 mg by mouth daily.     carvedilol (COREG) 6.25 MG tablet Take 1 tablet (6.25 mg total) by mouth 2 (two) times daily with a meal. 180 tablet 1   losartan (COZAAR) 25 MG tablet Take 0.5 tablets (12.5 mg total) by mouth daily. 90 tablet 3   No current facility-administered medications for this encounter.    Allergies  Allergen Reactions   Hydrocodone-Acetaminophen Other (See Comments)   Topiramate Other (See Comments)    Other Reaction(s): Worse headache   Hydrocodone  Severe Headache      Social History   Socioeconomic History   Marital status: Single    Spouse name: Not on file   Number of children: 2   Years of education: Not on file   Highest education level: Some college, no degree  Occupational History   Occupation: no   Occupation: [employer]  Tobacco Use   Smoking status: Former    Types: Cigarettes   Smokeless tobacco: Never  Vaping Use   Vaping status: Never Used  Substance and Sexual Activity   Alcohol use: No   Drug use: No   Sexual activity: Not on file  Other Topics Concern   Not on file  Social History Narrative   Not on file   Social Determinants of Health   Financial Resource Strain: High Risk (10/09/2022)   Overall Financial Resource Strain (CARDIA)    Difficulty of Paying Living Expenses: Hard  Food Insecurity: No Food Insecurity (10/08/2022)   Hunger Vital Sign    Worried About Running Out of Food in the Last Year: Never true    Ran Out of Food in the Last Year: Never true  Transportation Needs: No Transportation Needs (10/08/2022)   PRAPARE  - Administrator, Civil Service (Medical): No    Lack of Transportation (Non-Medical): No  Physical Activity: Not on file  Stress: Not on file  Social Connections: Unknown (06/05/2021)   Received from [facility], [facility]   Social Network    Social Network: Not on file  Intimate Partner Violence: Not At Risk (10/08/2022)   Humiliation, Afraid, Rape, and Kick questionnaire    Fear of Current or Ex-Partner: No    Emotionally Abused: No    Physically Abused: No    Sexually Abused: No      Family History  Problem Relation Age of Onset   Diabetes Mother    Glaucoma Mother    Breast cancer Neg Hx     Vitals:   10/24/22 1057  BP: (!) 90/50  Pulse: (!) 51  SpO2: 98%  Weight: 120.6 kg (265 lb 12.8 oz)    PHYSICAL EXAM: General:  Well appearing, obese. No respiratory difficulty HEENT: normal Neck: supple. Thick neck JVD not well visualized. Carotids 2+ bilat; no bruits. No lymphadenopathy or thryomegaly appreciated. Cor: PMI nondisplaced. Regular rate & rhythm. No rubs, gallops or murmurs. Lungs: clear Abdomen: obese, soft, nontender, nondistended. No hepatosplenomegaly. No bruits or masses. Good bowel sounds. Extremities: no cyanosis, clubbing, rash, edema Neuro: alert & oriented x 3, cranial nerves grossly intact. moves all 4 extremities w/o difficulty. Affect pleasant.  ECG: not performed    ASSESSMENT & PLAN:  1. Chronic Systolic Heart Failure - NICM, likely stressed induced/viral CM post COVID - Echo 9/24 EF 25-30% w/ severe hypokinesis of the entire apex with overall severe LV dysfunction; pattern suggestive of stress induced cardiomyopathy. RV normal. HS trop 118>>120 (low level, not what would be expected w/ acute myocarditis, likely demand ischemia from CHF)  - LHC minimal CAD. RHC w/ elevated R+L filling pressures and normal CO=> diuresed further w/ IV Lasix - Improved NYHA Class, now II (down from IIIb). Reports stable wt post d/c but volume  assessment difficult due to body habitus. Check BNP today to guide diuretic dose adjustments. Continue Lasix 40 mg daily for now - continue Jardiance 10 mg daily (samples supplied, pharmacy team working on pt assistance forms) - continue spironolactone 25 mg daily  - reduce Coreg down to 3.125  mg bid - add Losartan 12.5 mg daily (BP too soft for Entresto currently)  - BMP today  - will need 2D echo after GDMT fully optimized. If EF not improving, would recommend cardiac MRI.   2. HTN - controlled on current regimen - GDMT per above - check BMP today   3. Bradycardia - pulse rate 51 bpm. Reports HR in the upper 40s at home. + fatigue  - reduce Coreg to 3.125 mg bid  - reassess next visit. If still low may need to d/c     Referred to HFSW (PCP, Medications, Transportation, ETOH Abuse, Drug Abuse, Insurance, Financial ):  No Refer to Pharmacy: Yes Refer to Home Health: No Refer to Advanced Heart Failure Clinic: (TBD based on assessment next visit)  Refer to General Cardiology: Yes (already followed)   Follow up in [facility] clinic in 2 wks. She already had f/u w/ cardiology scheduled in 4 wks. Will reassess need for referral to [facility] next TOC visit. If she is tolerating GDMT titrations ok and not showing s/o low output or worsening NYHA functional status, then would be reasonable to continue further management per cardiology team.    Robbie Lis, PA-C 10/24/2022

## 2022-10-24 NOTE — Patient Instructions (Addendum)
DECREASE Carvedilol to 3.125 mg Twice daily  START Losartan 12.5 mg (1/2 Tab) daily  Labs done today, your results will be available in MyChart, we will contact you for abnormal readings.  Thank you for allowing [place] to provider your heart failure care after your recent hospitalization. Please follow-up with 2 weeks  If you have any questions, issues, or concerns before your next appointment please call our office at [phone], opt. 2 and leave a message for the triage nurse.

## 2022-10-24 NOTE — Progress Notes (Signed)
Medication Samples have been provided to the patient.  Drug name: Jardiance       Strength: 10 mg        Qty: 4  LOT: [id number]  Exp.Date: 11/2024  Dosing instructions: Take 1 tablet daily  The patient has been instructed regarding the correct time, dose, and frequency of taking this medication, including desired effects and most common side effects.   Smitty Cords Regnia Mathwig 11:31 AM 10/24/2022

## 2022-10-24 NOTE — Telephone Encounter (Signed)
-----   Message from [place] sent at 10/24/2022  1:16 PM EDT ----- Fluid marker (BNP) is way down. SCr is elevated. Suspect she may be a bit over diuresed. Instruct to stop daily Lasix and to only use PRN going forward, needs repeat BMP and BNP in 7 days.

## 2022-10-24 NOTE — Telephone Encounter (Signed)
Patient's lasix medication has been changed and updated in her chart. In addition, pt's labs has been ordered and her appointment scheduled. Pt aware, agreeable, and verbalized understanding.

## 2022-10-30 LAB — BASIC METABOLIC PANEL
BUN/Creatinine Ratio: 7 — ABNORMAL LOW (ref 12–28)
BUN: 12 mg/dL (ref 8–27)
CO2: 22 mmol/L (ref 20–29)
Calcium: 9.4 mg/dL (ref 8.7–10.3)
Chloride: 108 mmol/L — ABNORMAL HIGH (ref 96–106)
Creatinine, Ser: 1.68 mg/dL — ABNORMAL HIGH (ref 0.57–1.00)
Glucose: 94 mg/dL (ref 70–99)
Potassium: 4 mmol/L (ref 3.5–5.2)
Sodium: 143 mmol/L (ref 134–144)
eGFR: 33 mL/min/{1.73_m2} — ABNORMAL LOW (ref >59)

## 2022-10-31 ENCOUNTER — Other Ambulatory Visit (HOSPITAL_COMMUNITY): Payer: No Typology Code available for payment source

## 2022-11-02 ENCOUNTER — Telehealth: Payer: Self-pay

## 2022-11-02 NOTE — Telephone Encounter (Addendum)
Called patient regarding results. Left message for patient to call office.----- Message from Joni Reining sent at 10/30/2022  7:11 AM EDT ----- Labs reviewed. Creatinine has improved.  Not yet ready for Entresto. Will discuss at follow up appt

## 2022-11-07 ENCOUNTER — Ambulatory Visit (HOSPITAL_COMMUNITY)
Admission: RE | Admit: 2022-11-07 | Discharge: 2022-11-07 | Disposition: A | Discharge status: Home / Self Care | Payer: Medicaid Other | Source: Ambulatory Visit | Attending: Cardiology | Admitting: Cardiology

## 2022-11-07 ENCOUNTER — Encounter (HOSPITAL_COMMUNITY): Payer: Self-pay

## 2022-11-07 VITALS — BP 100/58 | HR 52 | Wt 266.8 lb

## 2022-11-07 DIAGNOSIS — I110 Hypertensive heart disease with heart failure: Secondary | ICD-10-CM | POA: Insufficient documentation

## 2022-11-07 DIAGNOSIS — Z7984 Long term (current) use of oral hypoglycemic drugs: Secondary | ICD-10-CM | POA: Insufficient documentation

## 2022-11-07 DIAGNOSIS — I428 Other cardiomyopathies: Secondary | ICD-10-CM | POA: Insufficient documentation

## 2022-11-07 DIAGNOSIS — R5383 Other fatigue: Secondary | ICD-10-CM | POA: Diagnosis present

## 2022-11-07 DIAGNOSIS — I2510 Atherosclerotic heart disease of native coronary artery without angina pectoris: Secondary | ICD-10-CM | POA: Diagnosis not present

## 2022-11-07 DIAGNOSIS — I272 Pulmonary hypertension, unspecified: Secondary | ICD-10-CM | POA: Diagnosis not present

## 2022-11-07 DIAGNOSIS — Z8616 Personal history of COVID-19: Secondary | ICD-10-CM | POA: Diagnosis not present

## 2022-11-07 DIAGNOSIS — Z79899 Other long term (current) drug therapy: Secondary | ICD-10-CM | POA: Diagnosis not present

## 2022-11-07 DIAGNOSIS — R0609 Other forms of dyspnea: Secondary | ICD-10-CM | POA: Insufficient documentation

## 2022-11-07 DIAGNOSIS — I5022 Chronic systolic (congestive) heart failure: Secondary | ICD-10-CM | POA: Diagnosis not present

## 2022-11-07 LAB — BASIC METABOLIC PANEL
Anion gap: 9 (ref 5–15)
BUN: 12 mg/dL (ref 8–23)
CO2: 22 mmol/L (ref 22–32)
Calcium: 9.7 mg/dL (ref 8.9–10.3)
Chloride: 109 mmol/L (ref 98–111)
Creatinine, Ser: 1.57 mg/dL — ABNORMAL HIGH (ref 0.44–1.00)
GFR, Estimated: 36 mL/min — ABNORMAL LOW (ref >60)
Glucose, Bld: 93 mg/dL (ref 70–99)
Potassium: 3.9 mmol/L (ref 3.5–5.1)
Sodium: 140 mmol/L (ref 135–145)

## 2022-11-07 LAB — BRAIN NATRIURETIC PEPTIDE: B Natriuretic Peptide: 84.8 pg/mL (ref 0.0–100.0)

## 2022-11-07 MED ORDER — LOSARTAN POTASSIUM 25 MG PO TABS: 25.0000 mg | ORAL_TABLET | Freq: Every day | ORAL | 2 refills | Status: DC

## 2022-11-07 NOTE — Progress Notes (Addendum)
HEART & VASCULAR TRANSITION OF CARE PROGRESS NOTE     Referring Physician: Dr. Alanda Slim Primary Care: Irven Coe, MD Primary Cardiologist: Chrystie Nose, MD  HPI: Referred to clinic by Dr. Alanda Slim for heart failure consultation.   This is a 67 year old female with a history of HTN, DLD and obesity recently admitted 9/24 after several weeks of progressive dyspnea on exertion, cough and fatigue following COVID-19 infection. Diagnosed w/ acute CHF. Echo EF 25-30% w/ severe hypokinesis of the entire apex with overall severe LV dysfunction; pattern suggestive of stress induced cardiomyopathy. RV normal. Hs trop 118>>120. 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 RHC showed high normal right and left-sided filling pressures, mild pulmonary hypertension and normal cardiac output (see complete hemodynamics below in cath report). She was continued on IV Lasix for further diuresis then transitioned to PO Lasix. GDMT added for systolic heart failure. Referred to TOC at d/c.   At initial visit 10/2, her lasix was stopped and switched to PRN given her BNP had normalized and Scr had bumped, concern for overdiuresis. Coreg  was also reduced due to bradycardia.   She presents back today for f/u. Reports doing ok. Functional status overall improved since hospitalization but reports mild continued exertional dyspnea and fatigue. EKG shows sinus bradycardia 46 bpm. BP 100/58. Reports full med compliance. No orthostatic symptoms.      Cardiac Testing   2D Echo 10/08/22 1. Severe hypokinesis of the entire apex with overall severe LV  dysfunction; pattern suggestive of stress induced cardiomyopathy.   2. Left ventricular ejection fraction, by estimation, is 25 to 30%. The   left ventricle has severely decreased function. The left ventricle  demonstrates regional wall motion abnormalities (see scoring  diagram/findings for description). The left  ventricular internal cavity size was moderately dilated. Left ventricular  diastolic parameters are consistent with Grade II diastolic dysfunction  (pseudonormalization).   3. Right ventricular systolic function is normal. The right ventricular  size is normal.   4. Left atrial size was mildly dilated.   5. A small pericardial effusion is present. The pericardial effusion is  circumferential. There is no evidence of cardiac tamponade.   6. The mitral valve is grossly normal. Mild mitral valve regurgitation.  No evidence of mitral stenosis.   7. The aortic valve is tricuspid. Aortic valve regurgitation is not  visualized. No aortic stenosis is present.   8. The inferior vena cava is dilated in size with <50% respiratory  variability, suggesting right atrial pressure of 15 mmHg.    R/LHC 10/10/22   There is moderate to severe left ventricular systolic dysfunction.   LV end diastolic pressure is mildly elevated.   The left ventricular ejection fraction is 25-35% by visual estimate.   1.  Minimal irregularities with no evidence of obstructive coronary artery disease. 2.  Moderately to severely reduced LV systolic function with wall motion abnormalities suggestive of stress-induced cardiomyopathy. 3.  Right heart catheterization showed high normal right and left-sided filling pressures, mild pulmonary hypertension and normal cardiac output.   Fick Cardiac Output 6.66 L/min  Fick Cardiac Output Index 2.94 (L/min)/BSA  RA A Wave 13 mmHg  RA V Wave 10 mmHg  RA Mean 11 mmHg  RV Systolic Pressure 34 mmHg  RV Diastolic Pressure 2 mmHg  RV EDP 13 mmHg  PA Systolic Pressure  34 mmHg  PA Diastolic Pressure 12 mmHg  PA Mean 29 mmHg  PW A Wave 19 mmHg  PW V Wave 18 mmHg  PW Mean 11 mmHg  AO Systolic Pressure 117 mmHg   AO Diastolic Pressure 73 mmHg  AO Mean 94 mmHg  LV Systolic Pressure 104 mmHg  LV Diastolic Pressure 14 mmHg  LV EDP 22 mmHg  AOp Systolic Pressure 119 mmHg  AOp Diastolic Pressure 80 mmHg  AOp Mean Pressure 94 mmHg  LVp Systolic Pressure 98 mmHg  LVp Diastolic Pressure 50 mmHg  LVp EDP Pressure 24 mmHg  QP/QS 1  TPVR Index 9.87 HRUI  TSVR Index 31.99 HRUI  PVR SVR Ratio 0.22  TPVR/TSVR Ratio 0.31     Review of Systems: [y] = yes, [ ]  = no   General: Weight gain [ ] ; Weight loss [ ] ; Anorexia [ ] ; Fatigue [Y ]; Fever [ ] ; Chills [ ] ; Weakness [ ]   Cardiac: Chest pain/pressure [ ] ; Resting SOB [ ] ; Exertional SOB [ Y]; Orthopnea [ ] ; Pedal Edema [ ] ; Palpitations [ ] ; Syncope [ ] ; Presyncope [ ] ; Paroxysmal nocturnal dyspnea[ ]   Pulmonary: Cough [ ] ; Wheezing[ ] ; Hemoptysis[ ] ; Sputum [ ] ; Snoring [ ]   GI: Vomiting[ ] ; Dysphagia[ ] ; Melena[ ] ; Hematochezia [ ] ; Heartburn[ ] ; Abdominal pain [ ] ; Constipation [ ] ; Diarrhea [ ] ; BRBPR [ ]   GU: Hematuria[ ] ; Dysuria [ ] ; Nocturia[ ]   Vascular: Pain in legs with walking [ ] ; Pain in feet with lying flat [ ] ; Non-healing sores [ ] ; Stroke [ ] ; TIA [ ] ; Slurred speech [ ] ;  Neuro: Headaches[ ] ; Vertigo[ ] ; Seizures[ ] ; Paresthesias[ ] ;Blurred vision [ ] ; Diplopia [ ] ; Vision changes [ ]   Ortho/Skin: Arthritis [ ] ; Joint pain [ ] ; Muscle pain [ ] ; Joint swelling [ ] ; Back Pain [ ] ; Rash [ ]   Psych: Depression[ ] ; Anxiety[ ]   Heme: Bleeding problems [ ] ; Clotting disorders [ ] ; Anemia [ ]   Endocrine: Diabetes [ ] ; Thyroid dysfunction[ ]    Past Medical History:  Diagnosis Date   Hypertension     Current Outpatient Medications  Medication Sig Dispense Refill   carvedilol (COREG) 6.25 MG tablet Take 1 tablet (6.25 mg total) by mouth 2 (two) times daily with a meal. 180 tablet 1   empagliflozin (JARDIANCE) 10 MG TABS tablet Take 1 tablet (10 mg total) by mouth daily before breakfast. 30 tablet 3   losartan (COZAAR) 25 MG tablet Take  0.5 tablets (12.5 mg total) by mouth daily. 90 tablet 3   rosuvastatin (CRESTOR) 40 MG tablet Take 1 tablet (40 mg total) by mouth at bedtime. 90 tablet 1   spironolactone (ALDACTONE) 25 MG tablet Take 25 mg by mouth daily.     No current facility-administered medications for this encounter.    Allergies  Allergen Reactions   Hydrocodone-Acetaminophen Other (See Comments)   Topiramate Other (See Comments)    Other Reaction(s): Worse headache   Hydrocodone     Severe Headache      Social History   Socioeconomic History   Marital status: Single    Spouse name: Not on file   Number of children: 2   Years of education: Not on file   Highest education level: Some college, no degree  Occupational History   Occupation: no   Occupation: [employer]  Tobacco Use   Smoking status: Former    Types: Cigarettes   Smokeless tobacco: Never  Advertising account planner  Vaping status: Never Used  Substance and Sexual Activity   Alcohol use: No   Drug use: No   Sexual activity: Not on file  Other Topics Concern   Not on file  Social History Narrative   Not on file   Social Determinants of Health   Financial Resource Strain: High Risk (10/09/2022)   Overall Financial Resource Strain (CARDIA)    Difficulty of Paying Living Expenses: Hard  Food Insecurity: No Food Insecurity (10/08/2022)   Hunger Vital Sign    Worried About Running Out of Food in the Last Year: Never true    Ran Out of Food in the Last Year: Never true  Transportation Needs: No Transportation Needs (10/08/2022)   PRAPARE - Administrator, Civil Service (Medical): No    Lack of Transportation (Non-Medical): No  Physical Activity: Not on file  Stress: Not on file  Social Connections: Unknown (06/05/2021)   [facility]   Social Network    Social Network: Not on file  Intimate Partner Violence: Not At Risk (10/08/2022)   Humiliation, Afraid, Rape, and Kick questionnaire    Fear of Current or  Ex-Partner: No    Emotionally Abused: No    Physically Abused: No    Sexually Abused: No      Family History  Problem Relation Age of Onset   Diabetes Mother    Glaucoma Mother    Breast cancer Neg Hx     Vitals:   11/07/22 1041  BP: (!) 100/58  Pulse: (!) 52  SpO2: 100%  Weight: 121 kg (266 lb 12.8 oz)    PHYSICAL EXAM: General:  Well appearing, obese. No respiratory difficulty HEENT: normal Neck: supple. Thick neck, JVD not well visualized. Carotids 2+ bilat; no bruits. No lymphadenopathy or thyromegaly appreciated. Cor: PMI nondisplaced. Regular rhythm, slow rate. No rubs, gallops or murmurs. Lungs: decreased BS at the bases bilaterally  Abdomen: soft, nontender, nondistended. No hepatosplenomegaly. No bruits or masses. Good bowel sounds. Extremities: no cyanosis, clubbing, rash, trace b/l pretibial edema Neuro: alert & oriented x 3, cranial nerves grossly intact. moves all 4 extremities w/o difficulty. Affect pleasant.   ECG: sinus bradycardia 46 bpm   ASSESSMENT & PLAN:  1. Chronic Systolic Heart Failure - NICM, likely stressed induced/viral CM post COVID - Echo 9/24 EF 25-30% w/ severe hypokinesis of the entire apex with overall severe LV dysfunction; pattern suggestive of stress induced cardiomyopathy. RV normal. HS trop 118>>120 (low level, not what would be expected w/ acute myocarditis, likely demand ischemia from CHF)  - LHC minimal CAD. RHC w/ elevated R+L filling pressures and normal CO=> diuresed further w/ IV Lasix - Improved NYHA Class, now II (down from IIIb). Volume assessment difficult due to body habitus. Check BNP today to guide diuretic dose adjustments. May need to add back PO lasix 2-3 days a wk  - continue Jardiance 10 mg daily  - continue spironolactone 25 mg daily  - Stop Coreg due to bradycardia and fatigue  - Increase Losartan to 25 daily. Consider eventual transition to [facility] if BP able to tolerate  - will need 2D echo after GDMT fully  optimized. If EF not improving, would recommend cardiac MRI.   2. HTN - controlled on current regimen - GDMT per above - check BMP today   3. Bradycardia - BP remains persistently low despite recent down titration of Coreg, 40s at home. + fatigue. EKG today shows SB 46 bpm - stop Coreg  Referred to HFSW (PCP, Medications, Transportation, ETOH Abuse, Drug Abuse, Insurance, Financial ):  No Refer to Pharmacy: Yes Refer to Home Health: No Refer to Advanced Heart Failure Clinic: No  Refer to General Cardiology: Yes (already followed)    Keep F/u w/ Gen cards 11/19/22   Robbie Lis, PA-C 11/07/2022

## 2022-11-07 NOTE — Patient Instructions (Addendum)
EKG done today.  Labs done today. We will contact you only if your labs are abnormal.  STOP Carvedilol (Coreg)  INCREASE Losartan to 25mg  (1 tablet) by mouth daily.   No other medication changes were made. Please continue all current medications as prescribed.  Thank you for allowing [place] to provide your heart failure care after your recent hospitalization. Please follow-up with General Cardiology.

## 2022-11-18 NOTE — Progress Notes (Unsigned)
  Cardiology Office Note:  .   Date:  11/18/2022  ID:  Samantha Bryant, DOB [birth year], MRN [id number] PCP: Irven Coe, MD  [facility] Providers Cardiologist:  Chrystie Nose MD HF Clinic: [facility]  }   History of Present Illness: .   Samantha Bryant is a 67 y.o. female history of hypertension, obesity, HFrEF with EF of 25 to 30% with severe hypokinesis of the entire apex and overall severe LV dysfunction in a pattern suggestive of stress-induced cardiomyopathy.  Right and left heart catheterization revealed only minimal irregularities with no evidence of obstructive coronary artery disease, moderately to severely reduced LV systolic function with wall motion abnormality suggestive of stress-induced cardiomyopathy (likely from viral etiology from COVID infection).  Is being followed in Advanced Heart Failure Clinic.  Last seen by them on 11/07/2022 she was continued on GDMT with exception of Coreg which was discontinued due to bradycardia and fatigue and losartan was increased to 25 mg daily.  Consideration for transition to [facility] if blood pressure would tolerate.   ROS: ***  Studies Reviewed: .   Right and Left Cardiac Cath 10/10/2022     There is moderate to severe left ventricular systolic dysfunction.   LV end diastolic pressure is mildly elevated.   The left ventricular ejection fraction is 25-35% by visual estimate.   1.  Minimal irregularities with no evidence of obstructive coronary artery disease. 2.  Moderately to severely reduced LV systolic function with wall motion abnormalities suggestive of stress-induced cardiomyopathy. 3.  Right heart catheterization showed high normal right and left-sided filling pressures, mild pulmonary hypertension and normal cardiac output.    *** EKG Interpretation Date/Time:    Ventricular Rate:    PR Interval:    QRS Duration:    QT Interval:    QTC Calculation:   R Axis:      Text Interpretation:      Physical Exam:   VS:   There were no vitals taken for this visit.   Wt Readings from Last 3 Encounters:  11/07/22 266 lb 12.8 oz (121 kg)  10/24/22 265 lb 12.8 oz (120.6 kg)  10/19/22 265 lb 9.6 oz (120.5 kg)    GEN: Well nourished, well developed in no acute distress NECK: No JVD; No carotid bruits CARDIAC: ***RRR, no murmurs, rubs, gallops RESPIRATORY:  Clear to auscultation without rales, wheezing or rhonchi  ABDOMEN: Soft, non-tender, non-distended EXTREMITIES:  No edema; No deformity   ASSESSMENT AND PLAN: .   ***    {Are you ordering a CV Procedure (e.g. stress test, cath, DCCV, TEE, etc)?   Press F2        :[id number]}    Signed, Bettey Mare. Liborio Nixon, ANP, AACC

## 2022-11-19 ENCOUNTER — Encounter: Payer: Self-pay | Admitting: Adult Health

## 2022-11-19 ENCOUNTER — Ambulatory Visit: Payer: No Typology Code available for payment source | Attending: Adult Health | Admitting: Adult Health

## 2022-11-19 VITALS — BP 89/54 | HR 58 | Ht 65.0 in | Wt 263.0 lb

## 2022-11-19 DIAGNOSIS — I5022 Chronic systolic (congestive) heart failure: Secondary | ICD-10-CM

## 2022-11-19 DIAGNOSIS — I10 Essential (primary) hypertension: Secondary | ICD-10-CM

## 2022-11-19 DIAGNOSIS — E7800 Pure hypercholesterolemia, unspecified: Secondary | ICD-10-CM

## 2022-11-19 MED ORDER — EMPAGLIFLOZIN 10 MG PO TABS: 10.0000 mg | ORAL_TABLET | Freq: Every day | ORAL | Status: DC

## 2022-11-19 NOTE — Patient Instructions (Signed)
Medication Instructions:  No Changes *If you need a refill on your cardiac medications before your next appointment, please call your pharmacy*   Lab Work: No Labs If you have labs (blood work) drawn today and your tests are completely normal, you will receive your results only by: [place] (if you have MyChart) OR A paper copy in the mail If you have any lab test that is abnormal or we need to change your treatment, we will call you to review the results.   Testing/Procedures: No Testing   Follow-Up: At [facility], you and your health needs are our priority.  As part of our continuing mission to provide you with exceptional heart care, we have created designated Provider Care Teams.  These Care Teams include your primary Cardiologist (physician) and Advanced Practice Providers (APPs -  Physician Assistants and Nurse Practitioners) who all work together to provide you with the care you need, when you need it.  We recommend signing up for the patient portal called "MyChart".  Sign up information is provided on this After Visit Summary.  MyChart is used to connect with patients for Virtual Visits (Telemedicine).  Patients are able to view lab/test results, encounter notes, upcoming appointments, etc.  Non-urgent messages can be sent to your provider as well.   To learn more about what you can do with MyChart, go to [email or web address].    Your next appointment:   6 month(s)  Provider:   Pixie Casino, MD

## 2022-11-22 ENCOUNTER — Telehealth: Payer: Self-pay

## 2022-11-22 NOTE — Telephone Encounter (Addendum)
Results reviewed with patient at follow up visit.----- Message from Joni Reining sent at 10/30/2022  7:11 AM EDT ----- Labs reviewed. Creatinine has improved.  Not yet ready for Entresto. Will discuss at follow up appt

## 2022-12-14 ENCOUNTER — Other Ambulatory Visit (HOSPITAL_COMMUNITY): Payer: Self-pay

## 2022-12-17 ENCOUNTER — Other Ambulatory Visit (HOSPITAL_COMMUNITY): Payer: Self-pay

## 2022-12-19 ENCOUNTER — Telehealth: Payer: Self-pay | Admitting: Internal Medicine

## 2022-12-19 MED ORDER — SPIRONOLACTONE 25 MG PO TABS: 25.0000 mg | ORAL_TABLET | Freq: Every day | ORAL | 3 refills | Status: DC

## 2022-12-19 NOTE — Telephone Encounter (Signed)
*  STAT* If patient is at the pharmacy, call can be transferred to refill team.   1. Which medications need to be refilled? (please list name of each medication and dose if known) spironolactone (ALDACTONE) 25 MG tablet  2. Which pharmacy/location (including street and city if local pharmacy) is medication to be sent to? [facility] [phone] - [place], [place] - [address]  3. Do they need a 30 day or 90 day supply? 90 day

## 2022-12-19 NOTE — Telephone Encounter (Signed)
RX sent to requested Pharmacy

## 2022-12-24 ENCOUNTER — Telehealth (HOSPITAL_COMMUNITY): Payer: Self-pay

## 2022-12-24 ENCOUNTER — Other Ambulatory Visit (HOSPITAL_COMMUNITY): Payer: Self-pay

## 2022-12-24 NOTE — Telephone Encounter (Signed)
Patient Advocate Encounter  Prior authorization for [place] Pepper has been submitted and approved. Test billing returns $4 for 90 day supply.  KeyEstanislado Pandy Effective: 12/24/2022 to 12/23/2023  Burnell Blanks, CPhT Rx Patient Advocate Phone: [phone]

## 2023-01-01 ENCOUNTER — Telehealth: Payer: Self-pay | Admitting: Internal Medicine

## 2023-01-01 ENCOUNTER — Other Ambulatory Visit (HOSPITAL_COMMUNITY): Payer: Self-pay

## 2023-01-01 NOTE — Telephone Encounter (Signed)
Pt c/o medication issue:  1. Name of Medication:     empagliflozin (JARDIANCE) 10 MG TABS tablet   2. How are you currently taking this medication (dosage and times per day)?   3. Are you having a reaction (difficulty breathing--STAT)?   4. What is your medication issue?   Patient stated she is following-up on getting this medication and has now been without the medication for 2 days.

## 2023-01-01 NOTE — Telephone Encounter (Signed)
Spoke with pt, aware piro Samantha Bryant was approved and she should be able to get the medication now.

## 2023-09-24 ENCOUNTER — Other Ambulatory Visit: Payer: Self-pay | Admitting: Adult Health

## 2023-12-11 ENCOUNTER — Telehealth: Payer: Self-pay | Admitting: Internal Medicine

## 2023-12-11 NOTE — Telephone Encounter (Signed)
*  STAT* If patient is at the pharmacy, call can be transferred to refill team.   1. Which medications need to be refilled? (please list name of each medication and dose if known)   spironolactone  (ALDACTONE ) 25 MG tablet   2. Would you like to learn more about the convenience, safety, & potential cost savings by using the [facility]?   3. Are you open to using the [facility] (Type [facility]. ).  4. Which pharmacy/location (including street and city if local pharmacy) is medication to be sent to?  [facility] [phone] - [place], [place] - [address]   5. Do they need a 30 day or 90 day supply?   90 day  Patient stated she has 3 tablets left.  Patient has appointment scheduled with HILARIO Ford, PA on 12/10.

## 2023-12-12 MED ORDER — SPIRONOLACTONE 25 MG PO TABS
25.0000 mg | ORAL_TABLET | Freq: Every day | ORAL | 3 refills | Status: AC
Start: 2023-12-12 — End: ?

## 2023-12-12 NOTE — Telephone Encounter (Signed)
 Refill sent.

## 2023-12-16 ENCOUNTER — Other Ambulatory Visit (HOSPITAL_COMMUNITY): Payer: Self-pay

## 2023-12-16 ENCOUNTER — Telehealth: Payer: Self-pay | Admitting: Pharmacy Technician

## 2023-12-16 NOTE — Telephone Encounter (Addendum)
     Bi-cares end 01/30/24 for jardiance

## 2023-12-17 ENCOUNTER — Ambulatory Visit (INDEPENDENT_AMBULATORY_CARE_PROVIDER_SITE_OTHER): Admitting: Podiatry

## 2023-12-17 DIAGNOSIS — M7752 Other enthesopathy of left foot: Secondary | ICD-10-CM | POA: Diagnosis not present

## 2023-12-17 DIAGNOSIS — M79672 Pain in left foot: Secondary | ICD-10-CM

## 2023-12-17 DIAGNOSIS — B351 Tinea unguium: Secondary | ICD-10-CM

## 2023-12-17 DIAGNOSIS — M7751 Other enthesopathy of right foot: Secondary | ICD-10-CM | POA: Diagnosis not present

## 2023-12-17 DIAGNOSIS — M79671 Pain in right foot: Secondary | ICD-10-CM | POA: Diagnosis not present

## 2023-12-17 MED ORDER — TERBINAFINE HCL 250 MG PO TABS: 250.0000 mg | ORAL_TABLET | Freq: Every day | ORAL | 1 refills | Status: AC

## 2023-12-17 NOTE — Progress Notes (Signed)
   Subjective:    HPI Presents with complaint of thick painful nails that causes discomfort with  walking and wearing shoes.  Have been this way for many years and have been getting worse.  Also complains of pain at the great toe bilaterally.  Gets a callus there and gets painful.  Also getting pain of the fifth MTP on the left foot.  This started bothering her in the past few months.   Objective:  Physical Exam   General: AAO x3, NAD  Vascular: DP and PT pulses palpable bilaterally.  Immedate capillary fill time digits. No significant lower extremity edema bilaterally.  Dermatological:  Onychomycotic mycotic changes nails 1 through 5 with discoloration nail and subungual debris and thickening of the nail with redness along the nail folds.  Tenderness with pressure on nail plates..  Hyperkeratotic lesions medial aspect hallux IPJ bilaterally  Neruologic:  Grossly intact B/L.  Light touch intact.  Normal Achilles tendon reflex bilaterally  Musculoskeletal:  Normal lower extremity muscle strength.  Tenderness at the plantar medial aspect of the hallux at the IPJ bilaterally.  No tenderness range of motion of the IPJ hallux.  Tailor's bunion deformity left with tenderness at fifth MTP left.  Assessment:  Painful onychomycotic nails 1 through 5 bilaterally. Pain feet b/l Bursitis medial hallux bilaterally and fifth MTP left     Plan:  - New patient office visit level 3 for evaluation and management -Discussed with with patient onychomycosis and etiology and treatment.  Discussed topical versus oral agents.  Discussed risk and benefits of both.  No history of hepatic or renal disease.  Patient would like to start oral Lamisil  treatment.  Explained that we would check labs every 6 weeks during course of treatment to monitor for any side effects. -Dispensed OTC orthotics bilaterally. -Dispensed cushion sleeve for hallux left -Rx: Lamisil  250 mg p.o. daily, refill x 1 -Labs ordered today  for liver function tests to monitor for any hepatic side effects from the Lamisil .  Return 6 weeks Lamisil  3

## 2023-12-20 ENCOUNTER — Other Ambulatory Visit: Payer: Self-pay | Admitting: Podiatry

## 2023-12-21 LAB — HEPATIC FUNCTION PANEL
ALT: 10 IU/L (ref 0–32)
AST: 15 IU/L (ref 0–40)
Albumin: 5 g/dL — ABNORMAL HIGH (ref 3.9–4.9)
Alkaline Phosphatase: 91 IU/L (ref 49–135)
Bilirubin Total: 0.4 mg/dL (ref 0.0–1.2)
Bilirubin, Direct: 0.15 mg/dL (ref 0.00–0.40)
Total Protein: 7.5 g/dL (ref 6.0–8.5)

## 2023-12-24 ENCOUNTER — Other Ambulatory Visit (HOSPITAL_COMMUNITY): Payer: Self-pay

## 2023-12-26 MED ORDER — JARDIANCE 10 MG PO TABS: 10.0000 mg | ORAL_TABLET | Freq: Every day | ORAL | 0 refills | Status: AC

## 2023-12-26 NOTE — Telephone Encounter (Signed)
 Hi, can the jardiance  prescription be sent to [facility]? She now has a engineer, manufacturing. Thank you   Patient Advocate Encounter   The patient was approved for a [employer] grant that will help cover the cost of jardiance  Total amount awarded, [id number].  Effective: 11/26/23 - 11/24/24   APW:[id number] ERW:[id number] Group:[id number] PI:[id number] [employer] ID: [id number]   Pharmacy provided with approval and processing information. Patient informed via mychart

## 2023-12-26 NOTE — Telephone Encounter (Signed)
 Sent in a 90 day refill to [facility].  Pt scheduled 01/01/24.

## 2023-12-26 NOTE — Addendum Note (Signed)
 Addended by: MEMORY DELON POUR on: 12/26/2023 03:11 PM   Modules accepted: Orders

## 2023-12-31 NOTE — Progress Notes (Unsigned)
  Cardiology Office Note   Date:  12/31/2023  ID:  Yaslene, Lindamood 07-Mar-1955, MRN [id number] PCP: Leonel Cole, MD  [facility] Providers Cardiologist:  Vinie JAYSON Maxcy, MD { Click to update primary MD,subspecialty MD or APP then REFRESH:1}    History of Present Illness Klover Priestly Kozuch is a 68 y.o. female with history of hypertension, obesity,HFrEF, former smoker, and hyperlipidemia.    She was utilized 10/08/2022 with 2 weeks of cough, DOE, and concern for acute HF.  Originally attributed symptoms to COVID.  Echo 10/08/2022 LVEF 25 to 30%, regional wall motion abnormalities, grade II DD, and severe hypokinesis of entire apex; pattern suggestive of stress-induced cardiomyopathy.  [facility] 10/10/2022 shows obstructive CAD, moderately to severely reduced LV systolic function with wall motion abnormality suggestive of stress-induced cardiomyopathy.  RHC showed high normal right and left-sided filling pressures, mild pulmonary hypertension and normal cardiac output.  She was diuresed with IV Lasix , developed mild AKI but improved and plateaued.  Has been difficult to increase GDMT d/t soft BP and bradycardia.  Last seen by advanced heart failure clinic 11/07/2022 and GDMT continued except Coreg  due to bradycardia.  She was last seen by cardiology 11/19/2022 hypotensive and concerns for treating hyperlipidemia with LDL of 183 on rosuvastatin  40 mg.  He has since been followed at [facility].    She presents today for overdue follow-up for HFrEF.  HFrEF-   Hypertension-  Hyperlipidemia- Last LDL 78 05/29/2023.  Educated on importance of high-fiber diet and 150 minutes of exercise per week.  Continue statin 40 mg.  ROS: All systems negative unless otherwise indicated in HPI.  Studies Reviewed      *** Risk Assessment/Calculations {Does this patient have ATRIAL FIBRILLATION?[phone]} No BP recorded.  {Refresh Note OR Click here to enter BP  :1}***       Physical Exam VS:  There were no vitals  taken for this visit.       Wt Readings from Last 3 Encounters:  11/19/22 263 lb (119.3 kg)  11/07/22 266 lb 12.8 oz (121 kg)  10/24/22 265 lb 12.8 oz (120.6 kg)    GEN: Well nourished, well developed in no acute distress NECK: No JVD; No carotid bruits CARDIAC: ***RRR, no murmurs, rubs, gallops RESPIRATORY:  Clear to auscultation without rales, wheezing or rhonchi  ABDOMEN: Soft, non-tender, non-distended EXTREMITIES:  No edema; No deformity   ASSESSMENT AND PLAN ***    {Are you ordering a CV Procedure (e.g. stress test, cath, DCCV, TEE, etc)?   Press F2        :[id number]}  Dispo: ***  Signed, Mardy KATHEE Pizza, FNP

## 2024-01-01 ENCOUNTER — Encounter: Payer: Self-pay | Admitting: Physician Assistant

## 2024-01-01 ENCOUNTER — Ambulatory Visit: Attending: Physician Assistant

## 2024-01-01 VITALS — BP 100/58 | HR 56 | Ht 65.0 in | Wt 226.8 lb

## 2024-01-01 DIAGNOSIS — I5020 Unspecified systolic (congestive) heart failure: Secondary | ICD-10-CM | POA: Diagnosis present

## 2024-01-01 DIAGNOSIS — I10 Essential (primary) hypertension: Secondary | ICD-10-CM | POA: Diagnosis not present

## 2024-01-01 DIAGNOSIS — E785 Hyperlipidemia, unspecified: Secondary | ICD-10-CM | POA: Insufficient documentation

## 2024-01-01 NOTE — Patient Instructions (Signed)
 Medication Instructions:  NO CHANGES *If you need a refill on your cardiac medications before your next appointment, please call your pharmacy*  Lab Work: NO LABS If you have labs (blood work) drawn today and your tests are completely normal, you will receive your results only by: MyChart Message (if you have MyChart) OR A paper copy in the mail If you have any lab test that is abnormal or we need to change your treatment, we will call you to review the results.  Testing/Procedures:[address] Your physician has requested that you have an echocardiogram. Echocardiography is a painless test that uses sound waves to create images of your heart. It provides your doctor with information about the size and shape of your heart and how well your hearts chambers and valves are working. This procedure takes approximately one hour. There are no restrictions for this procedure. Please do NOT wear cologne, perfume, aftershave, or lotions (deodorant is allowed). Please arrive 15 minutes prior to your appointment time.  Please note: We ask at that you not bring children with you during ultrasound (echo/ vascular) testing. Due to room size and safety concerns, children are not allowed in the ultrasound rooms during exams. Our front office staff cannot provide observation of children in our lobby area while testing is being conducted. An adult accompanying a patient to their appointment will only be allowed in the ultrasound room at the discretion of the ultrasound technician under special circumstances. We apologize for any inconvenience.   Follow-Up: At [facility], you and your health needs are our priority.  As part of our continuing mission to provide you with exceptional heart care, our providers are all part of one team.  This team includes your primary Cardiologist (physician) and Advanced Practice Providers or APPs (Physician Assistants and Nurse Practitioners) who all work together to  provide you with the care you need, when you need it.  Your next appointment:   6 month(s)  Provider:   Vinie JAYSON Maxcy, MD

## 2024-01-13 ENCOUNTER — Other Ambulatory Visit (HOSPITAL_COMMUNITY): Payer: Self-pay | Admitting: Cardiology

## 2024-01-16 ENCOUNTER — Other Ambulatory Visit: Payer: Self-pay

## 2024-01-16 ENCOUNTER — Emergency Department (HOSPITAL_COMMUNITY)

## 2024-01-16 ENCOUNTER — Emergency Department (HOSPITAL_COMMUNITY)
Admission: EM | Admit: 2024-01-16 | Discharge: 2024-01-16 | Disposition: A | Discharge status: Home / Self Care | Source: Ambulatory Visit | Attending: Emergency Medicine | Admitting: Emergency Medicine

## 2024-01-16 DIAGNOSIS — I10 Essential (primary) hypertension: Secondary | ICD-10-CM | POA: Diagnosis not present

## 2024-01-16 DIAGNOSIS — R059 Cough, unspecified: Secondary | ICD-10-CM | POA: Diagnosis present

## 2024-01-16 DIAGNOSIS — J101 Influenza due to other identified influenza virus with other respiratory manifestations: Secondary | ICD-10-CM | POA: Diagnosis not present

## 2024-01-16 LAB — BASIC METABOLIC PANEL WITH GFR
Anion gap: 8 (ref 5–15)
BUN: 11 mg/dL (ref 8–23)
CO2: 26 mmol/L (ref 22–32)
Calcium: 9.9 mg/dL (ref 8.9–10.3)
Chloride: 102 mmol/L (ref 98–111)
Creatinine, Ser: 1.29 mg/dL — ABNORMAL HIGH (ref 0.44–1.00)
GFR, Estimated: 45 mL/min — ABNORMAL LOW
Glucose, Bld: 92 mg/dL (ref 70–99)
Potassium: 4 mmol/L (ref 3.5–5.1)
Sodium: 136 mmol/L (ref 135–145)

## 2024-01-16 LAB — CBC
HCT: 37.6 % (ref 36.0–46.0)
Hemoglobin: 12.5 g/dL (ref 12.0–15.0)
MCH: 29.6 pg (ref 26.0–34.0)
MCHC: 33.2 g/dL (ref 30.0–36.0)
MCV: 89.1 fL (ref 80.0–100.0)
Platelets: 179 K/uL (ref 150–400)
RBC: 4.22 MIL/uL (ref 3.87–5.11)
RDW: 13.3 % (ref 11.5–15.5)
WBC: 4.2 K/uL (ref 4.0–10.5)
nRBC: 0 % (ref 0.0–0.2)

## 2024-01-16 LAB — TROPONIN T, HIGH SENSITIVITY
Troponin T High Sensitivity: 17 ng/L (ref 0–19)
Troponin T High Sensitivity: 16 ng/L (ref 0–19)

## 2024-01-16 LAB — D-DIMER, QUANTITATIVE: D-Dimer, Quant: 0.33 ug{FEU}/mL (ref 0.00–0.50)

## 2024-01-16 MED ORDER — ALBUTEROL SULFATE HFA 108 (90 BASE) MCG/ACT IN AERS
1.0000 | INHALATION_SPRAY | Freq: Once | RESPIRATORY_TRACT | Status: AC
  Administered 2024-01-16: 1 via RESPIRATORY_TRACT
  Filled 2024-01-16: qty 6.7

## 2024-01-16 MED ORDER — BENZONATATE 100 MG PO CAPS: 100.0000 mg | ORAL_CAPSULE | Freq: Three times a day (TID) | ORAL | 0 refills | Status: AC

## 2024-01-16 MED ORDER — BENZONATATE 100 MG PO CAPS
100.0000 mg | ORAL_CAPSULE | Freq: Once | ORAL | Status: AC
  Administered 2024-01-16: 100 mg via ORAL
  Filled 2024-01-16: qty 1

## 2024-01-16 NOTE — ED Triage Notes (Addendum)
 Flu a+ , sent by UC for chest pain, epigastric region , pt reports chest pain with cough , pt states pain was bad yesterday but not as much today

## 2024-01-16 NOTE — ED Provider Notes (Signed)
 " [place] EMERGENCY DEPARTMENT AT [facility] Provider Note  CSN: [id number] Arrival date & time: 01/16/24 1628  Chief Complaint(s) No chief complaint on file.  HPI Samantha Bryant is a 68 y.o. female who is here today for cough.  Patient was at an urgent care for cough, chest pain.  She tested positive for flu A, was recommended to come to the ED for chest pain workup.  Patient reports that she has been having a cough for the last 2 weeks, was given antibiotics by her PCP which did not help it.  She states that her cough worsened over the last day.  She has a history of heart failure.   Past Medical History Past Medical History:  Diagnosis Date   Hypertension    Patient Active Problem List   Diagnosis Date Noted   Elevated troponin 10/09/2022   Dyspnea on exertion 10/08/2022   Acute CHF (congestive heart failure) (HCC) 10/08/2022   Dyslipidemia, goal LDL below 70 10/08/2022   HTN (hypertension) 10/08/2022   Home Medication(s) Prior to Admission medications  Medication Sig Start Date End Date Taking? Authorizing Provider  benzonatate  (TESSALON ) 100 MG capsule Take 1 capsule (100 mg total) by mouth every 8 (eight) hours. 01/16/24  Yes Mannie Pac T, DO  JARDIANCE  10 MG TABS tablet Take 1 tablet (10 mg total) by mouth daily. 12/26/23   Mona Vinie BROCKS, MD  losartan  (COZAAR ) 25 MG tablet TAKE 1 TABLET(25 MG) BY MOUTH DAILY 01/15/24   Marcine Catalan M, PA-C  rosuvastatin  (CRESTOR ) 40 MG tablet Take 1 tablet (40 mg total) by mouth at bedtime. 10/13/22   Gonfa, Taye T, MD  spironolactone  (ALDACTONE ) 25 MG tablet Take 1 tablet (25 mg total) by mouth daily. 12/12/23   Hilty, Vinie BROCKS, MD  terbinafine  (LAMISIL ) 250 MG tablet Take 1 tablet (250 mg total) by mouth daily. 12/17/23 02/15/24  Christine Rush, DPM                                                                                                                                    Past Surgical History Past Surgical  History:  Procedure Laterality Date   ABDOMINAL HYSTERECTOMY     CARPAL TUNNEL RELEASE     KNEE ARTHROSCOPY     RIGHT/LEFT HEART CATH AND CORONARY ANGIOGRAPHY N/A 10/10/2022   Procedure: RIGHT/LEFT HEART CATH AND CORONARY ANGIOGRAPHY;  Surgeon: Darron Deatrice LABOR, MD;  Location: [facility] INVASIVE CV LAB;  Service: Cardiovascular;  Laterality: N/A;   TONSILLECTOMY     Family History Family History  Problem Relation Age of Onset   Diabetes Mother    Glaucoma Mother    Breast cancer Neg Hx     Social History Social History[1] Allergies Hydrocodone -acetaminophen , Topiramate, and Hydrocodone   Review of Systems Review of Systems  Physical Exam Vital Signs  I have reviewed the triage vital signs BP 108/64   Pulse 69  Temp 100.2 F (37.9 C) (Oral)   Resp 20   SpO2 96%   Physical Exam Vitals and nursing note reviewed.  HENT:     Head: Normocephalic.  Eyes:     Pupils: Pupils are equal, round, and reactive to light.  Cardiovascular:     Rate and Rhythm: Normal rate.  Pulmonary:     Effort: Pulmonary effort is normal.     Breath sounds: Normal breath sounds.     Comments: Cough present Abdominal:     General: Abdomen is flat. There is no distension.     Palpations: Abdomen is soft.     Tenderness: There is no abdominal tenderness. There is no guarding.  Musculoskeletal:        General: Normal range of motion.  Skin:    General: Skin is warm.  Neurological:     General: No focal deficit present.     Mental Status: She is alert.     ED Results and Treatments Labs (all labs ordered are listed, but only abnormal results are displayed) Labs Reviewed  BASIC METABOLIC PANEL WITH GFR - Abnormal; Notable for the following components:      Result Value   Creatinine, Ser 1.29 (*)    GFR, Estimated 45 (*)    All other components within normal limits  CBC  D-DIMER, QUANTITATIVE  TROPONIN T, HIGH SENSITIVITY  TROPONIN T, HIGH SENSITIVITY                                                                                                                           Radiology DG Chest 2 View Result Date: 01/16/2024 CLINICAL DATA:  Flu positive with chest pain and cough. EXAM: CHEST - 2 VIEW COMPARISON:  October 08, 2022 FINDINGS: The heart size and mediastinal contours are within normal limits. Increased suprahilar and infrahilar lung markings are noted, bilaterally, without evidence of an acute infiltrate, pleural effusion or pneumothorax. The visualized skeletal structures are unremarkable. IMPRESSION: Findings which may represent sequelae associated with viral bronchitis versus reactive airway disease. Electronically Signed   By: Suzen Dials M.D.   On: 01/16/2024 17:04    Pertinent labs & imaging results that were available during my care of the patient were reviewed by me and considered in my medical decision making (see MDM for details).  Medications Ordered in ED Medications  albuterol  (VENTOLIN  HFA) 108 (90 Base) MCG/ACT inhaler 1 puff (1 puff Inhalation Given 01/16/24 1732)  benzonatate  (TESSALON ) capsule 100 mg (100 mg Oral Given 01/16/24 1732)  Procedures Procedures  (including critical care time)  Medical Decision Making / ED Course   This patient presents to the ED for concern of cough and chest pain, this involves an extensive number of treatment options, and is a complaint that carries with it a high risk of complications and morbidity.  The differential diagnosis includes pleurisy, pneumonia, URI, viral syndrome, fluid overload.  MDM: Patient's symptoms are most likely being caused by influenza.  We are currently are in viral season, patient certainly could have had back-to-back viral URIs.  Will obtain chest x-ray, EKG, troponin.  Patient does not appear acutely fluid overloaded on my exam.  Reassessment 7:50  PM-patient's troponin flat at 16 on repeat.  Patient overall looks well.  She is appropriate for discharge.  Will send her prescription for Tessalon  Perles.   Additional history obtained: -Additional history obtained from outpatient urgent care note -External records from outside source obtained and reviewed including: Chart review including previous notes, labs, imaging, consultation notes   Lab Tests: -I ordered, reviewed, and interpreted labs.   The pertinent results include:   Labs Reviewed  BASIC METABOLIC PANEL WITH GFR - Abnormal; Notable for the following components:      Result Value   Creatinine, Ser 1.29 (*)    GFR, Estimated 45 (*)    All other components within normal limits  CBC  D-DIMER, QUANTITATIVE  TROPONIN T, HIGH SENSITIVITY  TROPONIN T, HIGH SENSITIVITY      EKG my independent review of the patient's EKG shows no ST segment depressions or elevations, no T wave inversions, no evidence of acute ischemia.  EKG Interpretation Date/Time:  Thursday January 16 2024 17:01:11 EST Ventricular Rate:  74 PR Interval:  179 QRS Duration:  100 QT Interval:  374 QTC Calculation: 415 R Axis:   4  Text Interpretation: Sinus rhythm S1,S2,S3 pattern Confirmed by Mannie Pac [phone]) on 01/16/2024 5:13:10 PM         Imaging Studies ordered: I ordered imaging studies including chest x-ray I independently visualized and interpreted imaging. I agree with the radiologist interpretation   Medicines ordered and prescription drug management: Meds ordered this encounter  Medications   albuterol  (VENTOLIN  HFA) 108 (90 Base) MCG/ACT inhaler 1 puff   benzonatate  (TESSALON ) capsule 100 mg   benzonatate  (TESSALON ) 100 MG capsule    Sig: Take 1 capsule (100 mg total) by mouth every 8 (eight) hours.    Dispense:  21 capsule    Refill:  0    -I have reviewed the patients home medicines and have made adjustments as needed  Cardiac Monitoring: The patient was maintained  on a cardiac monitor.  I personally viewed and interpreted the cardiac monitored which showed an underlying rhythm of: Normal sinus rhythm  Social Determinants of Health:  Factors impacting patients care include: Lack of access to primary care   Reevaluation: After the interventions noted above, I reevaluated the patient and found that they have :improved  Co morbidities that complicate the patient evaluation  Past Medical History:  Diagnosis Date   Hypertension       Dispostion: I considered admission for this patient, however with the reassuring workup she is appropriate for discharge.     Final Clinical Impression(s) / ED Diagnoses Final diagnoses:  Influenza A     @PCDICTATION @     [1]  Social History Tobacco Use   Smoking status: Former    Types: Cigarettes   Smokeless tobacco: Never  Vaping Use   Vaping status:  Never Used  Substance Use Topics   Alcohol use: No   Drug use: No     Mannie Fairy DASEN, DO 01/16/24 1955  "

## 2024-01-16 NOTE — Discharge Instructions (Addendum)
 While you were in the emergency room, you had blood work done to look for signs of injury to your heart or blood clots.  These test were negative.  He did test positive for influenza.  Your chest x-ray does not show pneumonia.  Have sent a prescription for a medicine called Tessalon  Perles which can help with cough.  Symptoms should begin to improve over the next 1 to 2 weeks.

## 2024-01-24 ENCOUNTER — Ambulatory Visit (INDEPENDENT_AMBULATORY_CARE_PROVIDER_SITE_OTHER): Admitting: Podiatry

## 2024-01-24 ENCOUNTER — Encounter: Payer: Self-pay | Admitting: Podiatry

## 2024-01-24 DIAGNOSIS — M79672 Pain in left foot: Secondary | ICD-10-CM

## 2024-01-24 DIAGNOSIS — M79671 Pain in right foot: Secondary | ICD-10-CM | POA: Diagnosis not present

## 2024-01-24 DIAGNOSIS — B351 Tinea unguium: Secondary | ICD-10-CM

## 2024-01-24 MED ORDER — TERBINAFINE HCL 250 MG PO TABS: 250.0000 mg | ORAL_TABLET | Freq: Every day | ORAL | 0 refills | Status: AC

## 2024-01-24 NOTE — Progress Notes (Signed)
   Subjective:    HPI Presents for follow-up onychomycosis treatment with p.o. Lamisil .  No problems taking medicine with no side effects noted.  Tenderness around toes with walking and wearing shoes.   Objective:  Physical Exam   General: AAO x3, NAD  Vascular: DP and PT pulses palpable bilaterally.  Immedate capillary fill time digits. No significant lower extremity edema bilaterally.  Dermatological: Onychomycotic mycotic changes nails 1 through 5 with discoloration nail and subungual debris and thickening of the nail. 0% Clearance of onychomycotic nail changes noted. Tenderness with walking and wearing shoes.  Neruologic: Grossly intact B/L  Musculoskeletal:   Assessment:  Painful onychomycotic nails 1 through 5 bilaterally. Pain feet b/l     Plan:  -Established office visit level 3 for evaluation and management -Patient is tolerating Lamisil  treatment for onychomycotic nails well.  No side effects noted. Will continue this treatment. -Rx: Lamisil  250 mg p.o. daily - Labs ordered today for liver function tests to monitor for any hepatic side effects from the Lamisil .  - Return in 6weeks for  Lamisil  final

## 2024-01-24 NOTE — Addendum Note (Signed)
 Addended by: Kylie Simmonds L on: 01/24/2024 09:55 AM   Modules accepted: Orders

## 2024-01-27 ENCOUNTER — Other Ambulatory Visit: Payer: Self-pay | Admitting: Podiatry

## 2024-01-28 LAB — HEPATIC FUNCTION PANEL
ALT: 16 IU/L (ref 0–32)
AST: 17 IU/L (ref 0–40)
Albumin: 4.3 g/dL (ref 3.9–4.9)
Alkaline Phosphatase: 70 IU/L (ref 49–135)
Bilirubin Total: 0.5 mg/dL (ref 0.0–1.2)
Bilirubin, Direct: 0.18 mg/dL (ref 0.00–0.40)
Total Protein: 6.1 g/dL (ref 6.0–8.5)

## 2024-02-12 ENCOUNTER — Ambulatory Visit: Payer: Self-pay

## 2024-02-12 ENCOUNTER — Ambulatory Visit (HOSPITAL_COMMUNITY)
Admission: RE | Admit: 2024-02-12 | Discharge: 2024-02-12 | Disposition: A | Discharge status: Home / Self Care | Payer: Self-pay | Source: Ambulatory Visit | Attending: Cardiology | Admitting: Cardiology

## 2024-02-12 DIAGNOSIS — I5020 Unspecified systolic (congestive) heart failure: Secondary | ICD-10-CM | POA: Diagnosis not present

## 2024-02-12 LAB — ECHOCARDIOGRAM COMPLETE
Area-P 1/2: 3.53 cm2
S' Lateral: 2.9 cm

## 2024-02-12 NOTE — Telephone Encounter (Signed)
 Called pt to discuss test results. Unable to contact pt. Mailbox full. Will try again at a later time. 1st attempt

## 2024-03-04 ENCOUNTER — Ambulatory Visit: Admitting: Podiatry

## 2025-01-19 ENCOUNTER — Ambulatory Visit: Admitting: Podiatry
